# Patient Record
Sex: Male | Born: 1955 | Race: Black or African American | Hispanic: No | Marital: Married | Smoking: Never smoker
Health system: Southern US, Community
[De-identification: ages and names within clinical notes are randomized; demographics above are authoritative.]

## PROBLEM LIST (undated history)

## (undated) DIAGNOSIS — K219 Gastro-esophageal reflux disease without esophagitis: Secondary | ICD-10-CM

## (undated) DIAGNOSIS — E119 Type 2 diabetes mellitus without complications: Secondary | ICD-10-CM

## (undated) HISTORY — PX: CHOLECYSTECTOMY: SHX55

## (undated) HISTORY — PX: GASTRIC BYPASS: SHX52

---

## 2007-10-06 ENCOUNTER — Emergency Department: Payer: Self-pay | Admitting: Emergency Medicine

## 2012-12-01 ENCOUNTER — Other Ambulatory Visit (HOSPITAL_BASED_OUTPATIENT_CLINIC_OR_DEPARTMENT_OTHER): Payer: Self-pay | Admitting: Family Medicine

## 2012-12-01 DIAGNOSIS — M25561 Pain in right knee: Secondary | ICD-10-CM

## 2012-12-01 DIAGNOSIS — M25562 Pain in left knee: Secondary | ICD-10-CM

## 2012-12-05 ENCOUNTER — Ambulatory Visit (HOSPITAL_BASED_OUTPATIENT_CLINIC_OR_DEPARTMENT_OTHER)
Admission: RE | Admit: 2012-12-05 | Discharge: 2012-12-05 | Disposition: A | Discharge status: Home / Self Care | Payer: Federal, State, Local not specified - PPO | Source: Ambulatory Visit | Attending: Family Medicine | Admitting: Family Medicine

## 2012-12-05 DIAGNOSIS — M1710 Unilateral primary osteoarthritis, unspecified knee: Secondary | ICD-10-CM | POA: Insufficient documentation

## 2012-12-05 DIAGNOSIS — M25562 Pain in left knee: Secondary | ICD-10-CM

## 2012-12-05 DIAGNOSIS — M7120 Synovial cyst of popliteal space [Baker], unspecified knee: Secondary | ICD-10-CM | POA: Insufficient documentation

## 2012-12-05 DIAGNOSIS — M25561 Pain in right knee: Secondary | ICD-10-CM

## 2012-12-05 DIAGNOSIS — IMO0002 Reserved for concepts with insufficient information to code with codable children: Secondary | ICD-10-CM | POA: Insufficient documentation

## 2012-12-05 DIAGNOSIS — M25569 Pain in unspecified knee: Secondary | ICD-10-CM | POA: Insufficient documentation

## 2012-12-05 DIAGNOSIS — M23305 Other meniscus derangements, unspecified medial meniscus, unspecified knee: Secondary | ICD-10-CM | POA: Insufficient documentation

## 2013-02-21 ENCOUNTER — Observation Stay (HOSPITAL_BASED_OUTPATIENT_CLINIC_OR_DEPARTMENT_OTHER)
Admission: EM | Admit: 2013-02-21 | Discharge: 2013-02-23 | Disposition: A | Discharge status: Home / Self Care | Payer: Federal, State, Local not specified - PPO | Attending: Internal Medicine | Admitting: Internal Medicine

## 2013-02-21 ENCOUNTER — Encounter (HOSPITAL_BASED_OUTPATIENT_CLINIC_OR_DEPARTMENT_OTHER): Payer: Self-pay | Admitting: *Deleted

## 2013-02-21 ENCOUNTER — Emergency Department (HOSPITAL_BASED_OUTPATIENT_CLINIC_OR_DEPARTMENT_OTHER): Payer: Federal, State, Local not specified - PPO

## 2013-02-21 DIAGNOSIS — E119 Type 2 diabetes mellitus without complications: Secondary | ICD-10-CM | POA: Diagnosis present

## 2013-02-21 DIAGNOSIS — R0902 Hypoxemia: Secondary | ICD-10-CM

## 2013-02-21 DIAGNOSIS — R001 Bradycardia, unspecified: Secondary | ICD-10-CM

## 2013-02-21 DIAGNOSIS — R079 Chest pain, unspecified: Principal | ICD-10-CM | POA: Diagnosis present

## 2013-02-21 DIAGNOSIS — Z6841 Body Mass Index (BMI) 40.0 and over, adult: Secondary | ICD-10-CM | POA: Insufficient documentation

## 2013-02-21 DIAGNOSIS — Z9884 Bariatric surgery status: Secondary | ICD-10-CM | POA: Insufficient documentation

## 2013-02-21 DIAGNOSIS — R5381 Other malaise: Secondary | ICD-10-CM

## 2013-02-21 HISTORY — DX: Morbid (severe) obesity due to excess calories: E66.01

## 2013-02-21 HISTORY — DX: Gastro-esophageal reflux disease without esophagitis: K21.9

## 2013-02-21 HISTORY — DX: Type 2 diabetes mellitus without complications: E11.9

## 2013-02-21 LAB — COMPREHENSIVE METABOLIC PANEL
Albumin: 3.7 g/dL (ref 3.5–5.2)
Alkaline Phosphatase: 71 U/L (ref 39–117)
BUN: 9 mg/dL (ref 6–23)
Calcium: 9.4 mg/dL (ref 8.4–10.5)
GFR calc Af Amer: >90 mL/min (ref >90)
Potassium: 4 mEq/L (ref 3.5–5.1)
Sodium: 141 mEq/L (ref 135–145)
Total Protein: 7.3 g/dL (ref 6.0–8.3)

## 2013-02-21 LAB — TROPONIN I
Troponin I: <0.3 ng/mL (ref <0.30)
Troponin I: <0.3 ng/mL (ref <0.30)

## 2013-02-21 LAB — GLUCOSE, CAPILLARY: Glucose-Capillary: 97 mg/dL (ref 70–99)

## 2013-02-21 LAB — CBC WITH DIFFERENTIAL/PLATELET
Basophils Relative: 0 % (ref 0–1)
Eosinophils Absolute: 0.1 10*3/uL (ref 0.0–0.7)
Eosinophils Relative: 2 % (ref 0–5)
MCH: 27.4 pg (ref 26.0–34.0)
MCHC: 35.3 g/dL (ref 30.0–36.0)
MCV: 77.8 fL — ABNORMAL LOW (ref 78.0–100.0)
Neutrophils Relative %: 64 % (ref 43–77)
Platelets: 231 10*3/uL (ref 150–400)

## 2013-02-21 LAB — PRO B NATRIURETIC PEPTIDE: Pro B Natriuretic peptide (BNP): 81.7 pg/mL (ref 0–125)

## 2013-02-21 MED ORDER — ALUM & MAG HYDROXIDE-SIMETH 200-200-20 MG/5ML PO SUSP: 30.0000 mL | Freq: Four times a day (QID) | ORAL | Status: DC | PRN

## 2013-02-21 MED ORDER — OXYCODONE HCL 5 MG PO TABS: 5.0000 mg | ORAL_TABLET | ORAL | Status: DC | PRN

## 2013-02-21 MED ORDER — ASPIRIN EC 325 MG PO TBEC
325.0000 mg | DELAYED_RELEASE_TABLET | Freq: Every day | ORAL | Status: DC
  Administered 2013-02-22 – 2013-02-23 (×2): 325 mg via ORAL
  Filled 2013-02-21 (×3): qty 1

## 2013-02-21 MED ORDER — ACETAMINOPHEN 650 MG RE SUPP: 650.0000 mg | Freq: Four times a day (QID) | RECTAL | Status: DC | PRN

## 2013-02-21 MED ORDER — SODIUM CHLORIDE 0.9 % IJ SOLN
3.0000 mL | Freq: Two times a day (BID) | INTRAMUSCULAR | Status: DC
  Administered 2013-02-22: 3 mL via INTRAVENOUS

## 2013-02-21 MED ORDER — ACETAMINOPHEN 325 MG PO TABS: 650.0000 mg | ORAL_TABLET | Freq: Four times a day (QID) | ORAL | Status: DC | PRN

## 2013-02-21 MED ORDER — ALBUTEROL SULFATE (5 MG/ML) 0.5% IN NEBU: 2.5000 mg | INHALATION_SOLUTION | RESPIRATORY_TRACT | Status: DC | PRN

## 2013-02-21 MED ORDER — SODIUM CHLORIDE 0.9 % IJ SOLN: 3.0000 mL | Freq: Two times a day (BID) | INTRAMUSCULAR | Status: DC

## 2013-02-21 MED ORDER — ONDANSETRON HCL 4 MG/2ML IJ SOLN: 4.0000 mg | Freq: Four times a day (QID) | INTRAMUSCULAR | Status: DC | PRN

## 2013-02-21 MED ORDER — ONDANSETRON HCL 4 MG PO TABS: 4.0000 mg | ORAL_TABLET | Freq: Four times a day (QID) | ORAL | Status: DC | PRN

## 2013-02-21 MED ORDER — GUAIFENESIN-DM 100-10 MG/5ML PO SYRP: 5.0000 mL | ORAL_SOLUTION | ORAL | Status: DC | PRN

## 2013-02-21 MED ORDER — SODIUM CHLORIDE 0.9 % IV BOLUS (SEPSIS)
500.0000 mL | Freq: Once | INTRAVENOUS | Status: AC
  Administered 2013-02-21: 500 mL via INTRAVENOUS

## 2013-02-21 MED ORDER — PANTOPRAZOLE SODIUM 40 MG PO TBEC
40.0000 mg | DELAYED_RELEASE_TABLET | Freq: Every day | ORAL | Status: DC
  Administered 2013-02-22 – 2013-02-23 (×2): 40 mg via ORAL
  Filled 2013-02-21 (×2): qty 1

## 2013-02-21 MED ORDER — SODIUM CHLORIDE 0.9 % IJ SOLN: 3.0000 mL | INTRAMUSCULAR | Status: DC | PRN

## 2013-02-21 MED ORDER — ASPIRIN 81 MG PO CHEW
324.0000 mg | CHEWABLE_TABLET | Freq: Once | ORAL | Status: AC
  Administered 2013-02-21: 324 mg via ORAL
  Filled 2013-02-21: qty 4

## 2013-02-21 MED ORDER — ENOXAPARIN SODIUM 40 MG/0.4ML ~~LOC~~ SOLN
40.0000 mg | SUBCUTANEOUS | Status: DC
  Administered 2013-02-21 – 2013-02-22 (×2): 40 mg via SUBCUTANEOUS
  Filled 2013-02-21 (×3): qty 0.4

## 2013-02-21 MED ORDER — SODIUM CHLORIDE 0.9 % IV SOLN: 250.0000 mL | INTRAVENOUS | Status: DC | PRN

## 2013-02-21 MED ORDER — NITROGLYCERIN 0.4 MG SL SUBL: 0.4000 mg | SUBLINGUAL_TABLET | SUBLINGUAL | Status: DC | PRN

## 2013-02-21 NOTE — ED Notes (Signed)
Patient in Xray

## 2013-02-21 NOTE — ED Notes (Signed)
States that he "just hasnt felt good" since Friday. He can't explain what hes feeling. Says that he just feels tired and has a slight headache. He says that every once in awhile he thinks he feels a little chest tightness. Patient very vague in description.

## 2013-02-21 NOTE — ED Provider Notes (Signed)
History     CSN: [id number]  Arrival date & time 02/21/13  1224   First MD Initiated Contact with Patient 02/21/13 1254      Chief Complaint  Patient presents with  . Fatigue    (Consider location/radiation/quality/duration/timing/severity/associated sxs/prior treatment) Patient is a 57 y.o. male presenting with chest pain. The history is provided by the patient.  Chest Pain Pain location:  Substernal area Pain quality: pressure   Pain radiates to:  Does not radiate Pain radiates to the back: no   Pain severity:  Mild Onset quality:  Unable to specify Timing:  Intermittent Progression:  Waxing and waning Chronicity:  Recurrent Context: not breathing, not eating, no intercourse, not lifting and not raising an arm   Relieved by:  Nothing Worsened by:  Nothing tried Ineffective treatments:  None tried Associated symptoms: near-syncope and weakness   Associated symptoms: no cough and no diaphoresis     Past Medical History  Diagnosis Date  . Diabetes mellitus without complication     Past Surgical History  Procedure Laterality Date  . Gastric bypass    . Cholecystectomy      No family history on file.  History  Substance Use Topics  . Smoking status: Never Smoker   . Smokeless tobacco: Not on file  . Alcohol Use: No      Review of Systems  Constitutional: Negative for diaphoresis.  Respiratory: Negative for cough.   Cardiovascular: Positive for chest pain and near-syncope.  Neurological: Positive for weakness.  All other systems reviewed and are negative.    Allergies  Review of patient's allergies indicates no known allergies.  Home Medications  No current outpatient prescriptions on file.  BP 91/76  Pulse 52  Temp(Src) 98.8 F (37.1 C) (Oral)  Resp 18  SpO2 98%  Physical Exam  Nursing note and vitals reviewed. Constitutional: He is oriented to person, place, and time. He appears well-developed and well-nourished.  HENT:  Head:  Normocephalic and atraumatic.  Eyes: Conjunctivae and EOM are normal. Pupils are equal, round, and reactive to light.  Neck: Normal range of motion. Neck supple.  Cardiovascular: Normal rate, regular rhythm, normal heart sounds and intact distal pulses.   Pulmonary/Chest: Effort normal and breath sounds normal.  Abdominal: Soft. Bowel sounds are normal.  Musculoskeletal: Normal range of motion. He exhibits no edema and no tenderness.  Neurological: He is alert and oriented to person, place, and time. He has normal reflexes.  Skin: Skin is warm and dry.  Psychiatric: He has a normal mood and affect. His behavior is normal. Judgment and thought content normal.    ED Course  Procedures (including critical care time)  Labs Reviewed  CBC WITH DIFFERENTIAL - Abnormal; Notable for the following:    MCV 77.8 (*)    RDW 16.2 (*)    All other components within normal limits  COMPREHENSIVE METABOLIC PANEL - Abnormal; Notable for the following:    Glucose, Bld 103 (*)    All other components within normal limits  TROPONIN I   Dg Chest 2 View  02/21/2013   *RADIOLOGY REPORT*  Clinical Data: Chest pain  CHEST - 2 VIEW  Comparison: None.  Findings: Negative for pulmonary edema, focal airspace consolidation, pleural effusion or pneumothorax.  The cardiopericardial silhouette is at the upper limits of normal for size.  The lungs are mildly hyperexpanded there is mild central airway thickening/peribronchial cuffing.  IMPRESSION:  1.  No acute cardiopulmonary disease 2.  Top normal heart size 3.  Mild pulmonary hyperexpansion and central bronchitic changes are nonspecific and can be seen with chronic obstruction both from the COPD and asthma.   Original Report Authenticated By: Malachy Moan, M.D.     No diagnosis found.   Date: 02/21/2013  Rate: 50  Rhythm: normal sinus rhythm  QRS Axis: normal  Intervals: normal  ST/T Wave abnormalities: nonspecific ST changes  Conduction Disutrbances:none   Narrative Interpretation:   Old EKG Reviewed: none available    MDM  Patient with new onset chest pain for two days.  Patient with abnormal ekg, but normal vital signs and lab w/u so far.       Discussed with Dr. Arthor Captain- plan observation for chest pain.   Hilario Quarry, MD 02/21/13 1538

## 2013-02-21 NOTE — H&P (Addendum)
PATIENT DETAILS Name: Mitchell Hampton Age: 57 y.o. Sex: male Date of Birth: [birth year] Admit Date: 02/21/2013 ZOX:[id number],WUJW, PA-C   CHIEF COMPLAINT:  Generalized weakness Chest pain  HPI: Mitchell Hampton is a 57 y.o. male with a Past Medical History of morbid obesity status post gastric bypass surgery in 2005, history of diabetes currently on diet and exercise control, who presents today with the above noted complaint. Patient claims that since Friday he has been having generalized fatigue, he claims that his does not feel his usual self. He denies any nausea, vomiting or diarrhea. He denies any abdominal pain. His complains are vague and he has difficulty elaborating. He claims that at work on Friday he did not "feel right", as a result he went home early. He claims that he gets this generalized fatigue spells that comes and goes. While at his mother's house today, he got another spell and as a result he presented to med center [facility] for further evaluation. He also claimed that he has been having intermittent mild chest discomfort for the past few days as well. He was then referred to the hospitalist service for further evaluation and treatment. He denies any active chest pain during my interview. He denies any shortness of breath either exertional or at rest. He denies any fever, nausea, vomiting, dysuria, hematuria or diarrhea. He does claim to have intermittent headaches that are currently not present.  ALLERGIES:  No Known Allergies  PAST MEDICAL HISTORY: Past Medical History  Diagnosis Date  . Diabetes mellitus without complication     PAST SURGICAL HISTORY: Past Surgical History  Procedure Laterality Date  . Gastric bypass    . Cholecystectomy      MEDICATIONS AT HOME: Prior to Admission medications   Not on File    FAMILY HISTORY: No family history of coronary artery disease of premature death.  SOCIAL HISTORY:  reports that he has never smoked. He does not have any  smokeless tobacco history on file. He reports that he does not drink alcohol or use illicit drugs.  REVIEW OF SYSTEMS:  Constitutional:   No  weight loss, night sweats,  Fevers, chills.  HEENT:    No headaches, Difficulty swallowing,Tooth/dental problems,Sore throat,  No sneezing, itching, ear ache, nasal congestion, post nasal drip,   Cardio-vascular: No chest pain,  Orthopnea, PND, swelling in lower extremities, anasarca,  dizziness, palpitations  GI:  No heartburn, indigestion, abdominal pain, nausea, vomiting, diarrhea, change in bowel habits, loss of appetite  Resp: No shortness of breath with exertion or at rest.  No excess mucus, no productive cough, No non-productive cough,  No coughing up of blood.No change in color of mucus.No wheezing.No chest wall deformity  Skin:  no rash or lesions.  GU:  no dysuria, change in color of urine, no urgency or frequency.  No flank pain.  Musculoskeletal: No joint pain or swelling.  No decreased range of motion.  No back pain.  Psych: No change in mood or affect. No depression or anxiety.  No memory loss.   PHYSICAL EXAM: Blood pressure 144/62, pulse 61, temperature 98.3 F (36.8 C), temperature source Oral, resp. rate 18, weight 144.697 kg (319 lb), SpO2 100.00%.  General appearance :Awake, alert, not in any distress. Speech Clear. Not toxic Looking HEENT: Atraumatic and Normocephalic, pupils equally reactive to light and accomodation Neck: supple, no JVD. No cervical lymphadenopathy.  Chest:Good air entry bilaterally, no added sounds  CVS: S1 S2 regular, no murmurs.  Abdomen: Bowel sounds present, Non tender  and not distended with no gaurding, rigidity or rebound. Extremities: B/L Lower Ext shows no edema, both legs are warm to touch Neurology: Awake alert, and oriented X 3, CN II-XII intact, Non focal Skin:No Rash Wounds:N/A  LABS ON ADMISSION:   Recent Labs  02/21/13 1327  NA 141  K 4.0  CL 105  CO2 28  GLUCOSE  103*  BUN 9  CREATININE 0.90  CALCIUM 9.4    Recent Labs  02/21/13 1327  AST 19  ALT 19  ALKPHOS 71  BILITOT 0.4  PROT 7.3  ALBUMIN 3.7   No results found for this basename: LIPASE, AMYLASE,  in the last 72 hours  Recent Labs  02/21/13 1327  WBC 8.3  NEUTROABS 5.3  HGB 14.1  HCT 40.0  MCV 77.8*  PLT 231    Recent Labs  02/21/13 1327  TROPONINI <0.30   No results found for this basename: DDIMER,  in the last 72 hours No components found with this basename: POCBNP,    RADIOLOGIC STUDIES ON ADMISSION: Dg Chest 2 View  02/21/2013   *RADIOLOGY REPORT*  Clinical Data: Chest pain  CHEST - 2 VIEW  Comparison: None.  Findings: Negative for pulmonary edema, focal airspace consolidation, pleural effusion or pneumothorax.  The cardiopericardial silhouette is at the upper limits of normal for size.  The lungs are mildly hyperexpanded there is mild central airway thickening/peribronchial cuffing.  IMPRESSION:  1.  No acute cardiopulmonary disease 2.  Top normal heart size 3.  Mild pulmonary hyperexpansion and central bronchitic changes are nonspecific and can be seen with chronic obstruction both from the COPD and asthma.   Original Report Authenticated By: Malachy Moan, M.D.     EKG: Independently reviewed. Mild sinus bradycardia  ASSESSMENT AND PLAN: Present on Admission:  . Chest pain - Very atypical, we'll cycle cardiac enzymes and get a 2-D echocardiogram  - Place on aspirin and PPI  - Monitor in telemetry and follow clinical course.   . Fatigue  -? Etiology  - No fever, no leukocytosis or any other symptoms that suggest infection at this point. Electrolytes are within normal limits as well. - We'll check a UA, TSH, A1c check a lipid panel in a.m. - Monitor overnight to see if something becomes more evident in the morning, suspect he may just need reassurance an outpatient followup with his primary care practitioner  . Morbid obesity - Counseled regarding  importance of weight loss. Unfortunately he is reading a significant amount of his weight loss gastric bypass surgery   . Diabetes - Apparently prior to his gastric bypass surgery in 2005, he was on oral hypoglycemic agents. This was discontinued post gastric bypass surgery and has been maintained on diet and exercise control. Will check the A1c.  Further plan will depend as patient's clinical course evolves and further radiologic and laboratory data become available. Patient will be monitored closely.   DVT Prophylaxis: Prophylactic Lovenox  Code Status: Full Code  Total time spent for admission equals 45 minutes.  [facility] Pager [phone]  If 7PM-7AM, please contact night-coverage [email or web address] Password [facility] 02/21/2013, 6:40 PM

## 2013-02-21 NOTE — Progress Notes (Signed)
PENDING ACCEPTANCE TRANFER NOTE:  Call received from:    D. Ray @ [facility]  REASON FOR REQUESTING TRANSFER:    Chest pressure  HPI:   H/o morbid obesity, came in complaining about fatigue and vague complaints of "just not feeling well", upon further questioning patient said he has mild chest pressure, substernal, which does not radiate, comes and goes. Has no H/O smoking, DM, HTN, PVD or CKD. His EKG showed bradycardia and Trop is negative. Dr. Rosalia Hammers felt he showed be observed to r/o ACS. I think he is low risk if his enzymes are negative can be D/C to follow up as OP for further eval   PLAN:  According to telephone report, this patient was accepted for transfer to [facility],   Under [facility] team:  10,  I have requested an order be written to call Flow Manager at [phone] upon patient arrival to the floor for final physician assignment who will do the admission and give admitting orders.  SIGNED: Clint Lipps, MD [facility]  02/21/2013, 3:38 PM

## 2013-02-22 ENCOUNTER — Encounter (HOSPITAL_COMMUNITY): Payer: Self-pay | Admitting: Nurse Practitioner

## 2013-02-22 ENCOUNTER — Observation Stay (HOSPITAL_COMMUNITY): Payer: Federal, State, Local not specified - PPO

## 2013-02-22 DIAGNOSIS — R0902 Hypoxemia: Secondary | ICD-10-CM

## 2013-02-22 DIAGNOSIS — I498 Other specified cardiac arrhythmias: Secondary | ICD-10-CM

## 2013-02-22 DIAGNOSIS — I517 Cardiomegaly: Secondary | ICD-10-CM

## 2013-02-22 DIAGNOSIS — R5381 Other malaise: Secondary | ICD-10-CM

## 2013-02-22 DIAGNOSIS — R5383 Other fatigue: Secondary | ICD-10-CM

## 2013-02-22 LAB — TROPONIN I
Troponin I: <0.3 ng/mL (ref <0.30)
Troponin I: <0.3 ng/mL (ref <0.30)

## 2013-02-22 LAB — GLUCOSE, CAPILLARY: Glucose-Capillary: 95 mg/dL (ref 70–99)

## 2013-02-22 LAB — D-DIMER, QUANTITATIVE: D-Dimer, Quant: 0.61 ug/mL-FEU — ABNORMAL HIGH (ref 0.00–0.48)

## 2013-02-22 LAB — LIPID PANEL
Cholesterol: 135 mg/dL (ref 0–200)
Total CHOL/HDL Ratio: 3.6 RATIO
Triglycerides: 139 mg/dL (ref <150)

## 2013-02-22 LAB — COMPREHENSIVE METABOLIC PANEL
Alkaline Phosphatase: 61 U/L (ref 39–117)
BUN: 9 mg/dL (ref 6–23)
Calcium: 8.8 mg/dL (ref 8.4–10.5)
Creatinine, Ser: 0.78 mg/dL (ref 0.50–1.35)
GFR calc Af Amer: >90 mL/min (ref >90)
Glucose, Bld: 91 mg/dL (ref 70–99)
Potassium: 4.1 mEq/L (ref 3.5–5.1)
Total Protein: 6.5 g/dL (ref 6.0–8.3)

## 2013-02-22 LAB — CBC
HCT: 36.4 % — ABNORMAL LOW (ref 39.0–52.0)
RBC: 4.7 MIL/uL (ref 4.22–5.81)
RDW: 15.6 % — ABNORMAL HIGH (ref 11.5–15.5)
WBC: 7.5 10*3/uL (ref 4.0–10.5)

## 2013-02-22 LAB — HEMOGLOBIN A1C
Hgb A1c MFr Bld: 6.3 % — ABNORMAL HIGH (ref <5.7)
Mean Plasma Glucose: 134 mg/dL — ABNORMAL HIGH (ref <117)

## 2013-02-22 LAB — TSH
TSH: 2.445 u[IU]/mL (ref 0.350–4.500)
TSH: 2.283 u[IU]/mL (ref 0.350–4.500)

## 2013-02-22 LAB — PROTIME-INR: INR: 0.99 (ref 0.00–1.49)

## 2013-02-22 MED ORDER — IOHEXOL 350 MG/ML SOLN
100.0000 mL | Freq: Once | INTRAVENOUS | Status: AC | PRN
  Administered 2013-02-22: 100 mL via INTRAVENOUS

## 2013-02-22 NOTE — Progress Notes (Signed)
Ambulated patient per order. Pt's resting HR upper 40's. HR increased as high as 92 with O2 sats decreasing to 89% on RA. Pt currently resting comfortably in bed with HR back in low 50's with sats at 99% RA.

## 2013-02-22 NOTE — Progress Notes (Signed)
Utilization review completed.  

## 2013-02-22 NOTE — Progress Notes (Signed)
[facility] PROGRESS NOTE  Mitchell Hampton ZOX:[id number] DOB: 02-16-1956 DOA: 02/21/2013 PCP: Mitchell Hampton  Assessment/Plan: Chest Pain/Fatigue/Hypoxemia/Hypoglycemia -He has now ruled out for ACS. -Cards consult requested to see if further cardiac workup needed as an inpatient. -Patient was noted to desat to 89% with ambulation. -DDimer returned elevated at 0.61. Will order CT Angio Chest to r/o PE. -Was also noted to be hypoglycemic: has a h/o DM, but is currently diet controlled. -Hypoglycemia could be contributing to fatigue. Will check CBGs q 4 hours. -Appreciate cardiology recs. -If CTA negative, further workup for his fatigue can be performed as an outpatient. -B12 ok at >400.  Code Status: Full code Family Communication: Wife Mitchell Hampton at bedside  Disposition Plan: Home when ready, anticipate 24-48 hours.   Consultants:  Cardiology, Dr. Tenny Hampton   Antibiotics:  None   Subjective: No complaints. Wants to go home.  Objective: Filed Vitals:   02/21/13 1650 02/21/13 1811 02/21/13 2113 02/22/13 0700  BP: 133/81 144/62 111/60 106/63  Pulse: 55 61 60 51  Temp:  98.3 F (36.8 C) 98.7 F (37.1 C) 98.3 F (36.8 C)  TempSrc:  Oral Oral   Resp: 16 18 18 18   Height:      Weight:      SpO2: 100% 100% 96% 94%   No intake or output data in the 24 hours ending 02/22/13 1414 Filed Weights   02/21/13 1509  Weight: 144.697 kg (319 lb)    Exam:   General:  AA Ox3, NAD  Cardiovascular: RRR, no M/R/G  Respiratory: CTA B  Abdomen: Obse/S/NT/ND/+BS/no masses or organomegaly noted  Extremities: no C/C/E/+pedal pulses   Neurologic:  Grossly intact and non-focal.  Data Reviewed: Basic Metabolic Panel:  Recent Labs Lab 02/21/13 1327 02/22/13 0415  NA 141 142  K 4.0 4.1  CL 105 109  CO2 28 27  GLUCOSE 103* 91  BUN 9 9  CREATININE 0.90 0.78  CALCIUM 9.4 8.8   Liver Function Tests:  Recent Labs Lab 02/21/13 1327 02/22/13 0415  AST 19 18  ALT 19 15   ALKPHOS 71 61  BILITOT 0.4 0.4  PROT 7.3 6.5  ALBUMIN 3.7 3.2*   No results found for this basename: LIPASE, AMYLASE,  in the last 168 hours No results found for this basename: AMMONIA,  in the last 168 hours CBC:  Recent Labs Lab 02/21/13 1327 02/22/13 0415  WBC 8.3 7.5  NEUTROABS 5.3  --   HGB 14.1 12.7*  HCT 40.0 36.4*  MCV 77.8* 77.4*  PLT 231 221   Cardiac Enzymes:  Recent Labs Lab 02/21/13 1327 02/21/13 2019 02/22/13 0139 02/22/13 0740  TROPONINI <0.30 <0.30 <0.30 <0.30   BNP (last 3 results)  Recent Labs  02/21/13 2019  PROBNP 81.7   CBG:  Recent Labs Lab 02/21/13 2201 02/22/13 0731 02/22/13 1150  GLUCAP 97 95 60*    No results found for this or any previous visit (from the past 240 hour(s)).   Studies: Dg Chest 2 View  02/21/2013   *RADIOLOGY REPORT*  Clinical Data: Chest pain  CHEST - 2 VIEW  Comparison: None.  Findings: Negative for pulmonary edema, focal airspace consolidation, pleural effusion or pneumothorax.  The cardiopericardial silhouette is at the upper limits of normal for size.  The lungs are mildly hyperexpanded there is mild central airway thickening/peribronchial cuffing.  IMPRESSION:  1.  No acute cardiopulmonary disease 2.  Top normal heart size 3.  Mild pulmonary hyperexpansion and central bronchitic changes are nonspecific and can  be seen with chronic obstruction both from the COPD and asthma.   Original Report Authenticated By: Mitchell Hampton, M.D.    Scheduled Meds: . aspirin EC  325 mg Oral Daily  . enoxaparin (LOVENOX) injection  40 mg Subcutaneous Q24H  . pantoprazole  40 mg Oral Q1200  . sodium chloride  3 mL Intravenous Q12H  . sodium chloride  3 mL Intravenous Q12H   Continuous Infusions:   Principal Problem:   Chest pain Active Problems:   Morbid obesity   Diabetes   Hypoxemia   Other malaise and fatigue    Time spent: 35 minutes    Mitchell Hampton  [facility] Pager [phone]  If  7PM-7AM, please contact night-coverage at [email or web address], password [facility] 02/22/2013, 2:14 PM  LOS: 1 day

## 2013-02-22 NOTE — H&P (Signed)
CARDIOLOGY CONSULT NOTE  Patient ID: Mitchell Hampton MRN: [id number], DOB/AGE: [birth year]   Admit date: 02/21/2013 Date of Consult: 02/22/2013  Primary Physician: Ethel Rana Primary Cardiologist: new - seen by P. Tenny Craw, MD   Pt. Profile  57 y/o male w/o prior cardiac hx who was admitted yesterday with fatigue/malaise and also reported intermittent indigestion-like chest pain.  Problem List  Past Medical History  Diagnosis Date  . Diabetes mellitus without complication     a. diet controlled (01/2013)  . Morbid obesity     a. s/p gastric bypass  . GERD (gastroesophageal reflux disease)     Past Surgical History  Procedure Laterality Date  . Gastric bypass      a. 2005  . Cholecystectomy      a. 2004     Allergies  No Known Allergies  HPI   57 y/o male without prior cardiac history.  He does have a h/o diabetes, which is currently diet-controlled.  He is also s/p gastric bypass in 2005.  Though he initially lost weight, he has gained much of it back and is currently 319 lbs.  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  He was in his USOH until 6/6, when while @ work he noted "not feeling like [him]self."  He describes this further by saying that he felt somewhat fatigued.  He was not experiencing chest pain, sob, or palpitations.  He left work and went to Urgent Care where labs were evaluated and were apparently unrevealing.  He was sent home w/o further treatment.  He continued to feel malaise throughout the evening on Friday but later slept well and felt pretty normal throughout the early afternoon hours of Saturday.  He did note recurrent fatigue and malaise however late Saturday afternoon, again without associated Ss.  When he laid down for bed on Saturday night, he felt mildly dyspneic.  This passed with sitting up for a few mins and then he went on to sleep normally w/o recurrent orthopnea.  On Sunday morning he felt well again but then after watering his lawn,  again noted malaise w/o associated Ss.  He and his wife decided to come into the ED for eval.  There, ECG was normal, as were troponins and other blood work.  He reported that he sometimes experiences midsternal/epigastric pain that has been successfully treated over the years with prn pepcid.  He has r/o for MI.  He has had no further chest pain or malaise/fatigue.  We've been asked to eval.  Inpatient Medications  . aspirin EC  325 mg Oral Daily  . enoxaparin (LOVENOX) injection  40 mg Subcutaneous Q24H  . pantoprazole  40 mg Oral Q1200  . sodium chloride  3 mL Intravenous Q12H  . sodium chloride  3 mL Intravenous Q12H   Family History Family History  Problem Relation Age of Onset  . Stroke Mother     alive in her late 34's, cva in her 34's.  . Other Father     unaware of his medical hx.  . Other Other     1/2 brother with h/o enlarged heart and sudden death.    Social History History   Social History  . Marital Status: Married    Spouse Name: N/A    Number of Children: N/A  . Years of Education: N/A   Occupational History  . Not on file.   Social History Main Topics  . Smoking status: Never Smoker   . Smokeless  tobacco: Not on file  . Alcohol Use: No  . Drug Use: No  . Sexually Active: Not on file   Other Topics Concern  . Not on file   Social History Narrative   Lives in [place] with wife.  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  He does not routinely exercise.  He has gained back much of his pre-gastric bypass surgery weight.    Review of Systems  General:  Generalized malaise/fatigue.  No chills, fever, night sweats or weight changes.  Cardiovascular:  Occasional epigastric and midsternal chest pain since his gastric bypass in 2005.  No recent change in frequency.  No dyspnea on exertion though he has noted some orthopnea over the past 2-3 days.  No edema, palpitations, paroxysmal nocturnal dyspnea. Dermatological: No rash,  lesions/masses Respiratory: No cough, dyspnea Urologic: No hematuria, dysuria Abdominal:   No nausea, vomiting, diarrhea, bright red blood per rectum, melena, or hematemesis Neurologic:  No visual changes, wkns, changes in mental status. All other systems reviewed and are otherwise negative except as noted above.  Physical Exam  Blood pressure 106/63, pulse 51, temperature 98.3 F (36.8 C), temperature source Oral, resp. rate 18, height 6' (1.829 m), weight 319 lb (144.697 kg), SpO2 94.00%.  General: Pleasant, NAD Psych: Normal affect. Neuro: Alert and oriented X 3. Moves all extremities spontaneously. HEENT: Normal  Neck: Supple without bruits or JVD. Lungs:  Resp regular and unlabored, CTA. Heart: RRR no s3, s4, or murmurs. Abdomen: Soft, non-tender, non-distended, BS + x 4.  Extremities: No clubbing, cyanosis or edema. DP/PT/Radials 2+ and equal bilaterally.  Labs   Recent Labs  02/21/13 1327 02/21/13 2019 02/22/13 0139 02/22/13 0740  TROPONINI <0.30 <0.30 <0.30 <0.30   Lab Results  Component Value Date   WBC 7.5 02/22/2013   HGB 12.7* 02/22/2013   HCT 36.4* 02/22/2013   MCV 77.4* 02/22/2013   PLT 221 02/22/2013     Recent Labs Lab 02/22/13 0415  NA 142  K 4.1  CL 109  CO2 27  BUN 9  CREATININE 0.78  CALCIUM 8.8  PROT 6.5  BILITOT 0.4  ALKPHOS 61  ALT 15  AST 18  GLUCOSE 91   Lab Results  Component Value Date   CHOL 135 02/22/2013   HDL 37* 02/22/2013   LDLCALC 70 02/22/2013   TRIG 139 02/22/2013   Radiology/Studies  Dg Chest 2 View  02/21/2013   *RADIOLOGY REPORT*  Clinical Data: Chest pain  CHEST - 2 VIEW  Comparison: None.  Findings: Negative for pulmonary edema, focal airspace consolidation, pleural effusion or pneumothorax.  The cardiopericardial silhouette is at the upper limits of normal for size.  The lungs are mildly hyperexpanded there is mild central airway thickening/peribronchial cuffing.  IMPRESSION:  1.  No acute cardiopulmonary disease 2.  Top  normal heart size 3.  Mild pulmonary hyperexpansion and central bronchitic changes are nonspecific and can be seen with chronic obstruction both from the COPD and asthma.   Original Report Authenticated By: Malachy Moan, M.D.   ECG  Sb, 50, no acute st/t changes.  ASSESSMENT AND PLAN  1.  Chest Pain:  Atypical for angina.  Pt reports hx of this pain dating back to gastric bypass in 2005 and believes it to be indigestion.  More likely to occur if he lies down too soon after a meal and always resolves with prn pepcid.  CE negative and ECG w/o any acute findings.  In absence of objective  evidence of ischemia with atypical Ss, would not rec further inpatient ischemic eval.  Cont PPI.  Can consider outpt ETT to r/o ischemia and assess chronotropic response.  2.  GERD:  PPI as above.  3.  Fatigue/malaise:  ? Etiology.  No evidence of ischemia.  His resting HR is slow - 40's to 50's in the absence of AVN blocking agents.  Ambulate here to assess for chronotropic response and consider outpt ETT as above.  Check TSH if not already ordered.  4.  Anemia:  CBC nl on admission.  H/H drop likely 2/2 blood draws.  No evidence of bleeding.  5.  DM:  Diet controlled.  Glucose 91 this AM.   Signed, Nicolasa Ducking, NP 02/22/2013, 10:18 AM  Patient seen and examined.  Agree with findings as noted above.  Patient with weak spells  Of note he was weak a little earlier today and glucose was 61 at the time.  Question if this is related to spells With walking his HR did increase some  He has had no signif pauses.  ALso of note his O2 sat on RA after walking went to 89%  On exam, patient is in NAD  Neck  JVP normal  Lungs:  CTA  Moving air.  Cardiac RRR.  No S3  No Murmurs  Ext No edema. I am not convinced weak spells related to cardiac problems  He does have some risk factors REcomm:  Check d dimer.  Check O2 sat walking again Would follow glucose esp when having spells. We can set up for outpatient stress  myoview to r/o ischemia and evaluate HR responsiveness.

## 2013-02-22 NOTE — Progress Notes (Signed)
  Echocardiogram 2D Echocardiogram has been performed.  Pollyanna Levay FRANCES 02/22/2013, 3:33 PM

## 2013-02-23 LAB — URINALYSIS, ROUTINE W REFLEX MICROSCOPIC
Hgb urine dipstick: NEGATIVE
Nitrite: NEGATIVE
Specific Gravity, Urine: 1.022 (ref 1.005–1.030)
Urobilinogen, UA: 0.2 mg/dL (ref 0.0–1.0)

## 2013-02-23 LAB — GLUCOSE, CAPILLARY
Glucose-Capillary: 126 mg/dL — ABNORMAL HIGH (ref 70–99)
Glucose-Capillary: 93 mg/dL (ref 70–99)
Glucose-Capillary: 94 mg/dL (ref 70–99)

## 2013-02-23 MED ORDER — ASPIRIN 325 MG PO TBEC: 325.0000 mg | DELAYED_RELEASE_TABLET | Freq: Every day | ORAL | Status: AC

## 2013-02-23 NOTE — Progress Notes (Signed)
Patient ID: Mitchell Hampton, male   DOB: 21-Jun-1956, 57 y.o.   MRN: [id number]   Please refer to the full consultation done yesterday. 2-D echo is technically difficult but revealed no diagnostic abnormalities. Recommendations are in the note yesterday. Cardiology will sign off.  Jerral Bonito, MD

## 2013-02-23 NOTE — Discharge Summary (Signed)
Physician Discharge Summary  Mitchell Hampton ZOX:[id number] DOB: Mar 12, 1956 DOA: 02/21/2013  PCP: Ethel Rana  Admit date: 02/21/2013 Discharge date: 02/23/2013  Time spent: Greater than 30 minutes  Recommendations for Outpatient Follow-up:  -Advised to follow up with his PCP in 2 weeks. -Has an exercise stress test scheduled on 6/25 with LB cards.   Discharge Diagnoses:  Principal Problem:   Chest pain Active Problems:   Morbid obesity   Diabetes   Hypoxemia   Other malaise and fatigue   Discharge Condition: Stable and improved.  Filed Weights   02/21/13 1509 02/23/13 0444  Weight: 144.697 kg (319 lb) 147.192 kg (324 lb 8 oz)    History of present illness:  Patient is a 57 y.o. male with a Past Medical History of morbid obesity status post gastric bypass surgery in 2005, history of diabetes currently on diet and exercise control, who presents today with CP and fatigue. Patient claims that since Friday he has been having generalized fatigue, he claims that his does not feel his usual self. He denies any nausea, vomiting or diarrhea. He denies any abdominal pain. His complains are vague and he has difficulty elaborating. He claims that at work on Friday he did not "feel right", as a result he went home early. He claims that he gets this generalized fatigue spells that comes and goes. While at his mother's house today, he got another spell and as a result he presented to med center [facility] for further evaluation. He also claimed that he has been having intermittent mild chest discomfort for the past few days as well. He was then referred to the hospitalist service for further evaluation and treatment.  He denies any active chest pain during my interview. He denies any shortness of breath either exertional or at rest. He denies any fever, nausea, vomiting, dysuria, hematuria or diarrhea. He does claim to have intermittent headaches that are currently not present.   Hospital Course:    Chest Pain -Ruled out for ACS. -Because of his risk factors has been scheduled for OP stress test as detailed above. -CT Angio negative for PE.  Hypoxemia -Transient with ambulation. -Has been ambulated today with sats remaining above 95% on RA. -CT Angio negative for PE. -CT did show some bibasilar atx, which could have been contributing.  Chronic Fatigue -TSH/B12 WNL. -Continue OP workup for this issue.  Diet-Controlled DM -Had one episode of hypoglycemia.  Procedures:  None   Consultations:  Cardiology, Dr. Tenny Craw  Discharge Instructions  Discharge Orders   Future Appointments Provider Department Dept Phone   03/10/2013 2:30 PM Beatrice Lecher, PA-C [facility] Main Office [place]) [phone]   Joint Appt [facility] Main Office [place]) [phone]   Future Orders Complete By Expires     Diet - low sodium heart healthy  As directed     Discontinue IV  As directed     Increase activity slowly  As directed         Medication List    TAKE these medications       aspirin 325 MG EC tablet  Take 1 tablet (325 mg total) by mouth daily.     multivitamin with minerals Tabs  Take 1 tablet by mouth daily.     VITAMIN B-12 PO  Take 1 tablet by mouth daily.       No Known Allergies     Follow-up Information   Follow up with Tereso Newcomer, PA-C On 03/10/2013. (2:30 - Exercise  Treadmill Test - wear comfortable clothing and sneakers.)    Contact information:   [address] [place] [address] [phone]       Follow up with HEPLER,MARK, PA-C. Schedule an appointment as soon as possible for a visit in 2 weeks.   Contact information:   [address] [place] [address] [phone]        The results of significant diagnostics from this hospitalization (including imaging, microbiology, ancillary and laboratory) are listed below for reference.    Significant Diagnostic Studies: Dg Chest 2  View  02/21/2013   *RADIOLOGY REPORT*  Clinical Data: Chest pain  CHEST - 2 VIEW  Comparison: None.  Findings: Negative for pulmonary edema, focal airspace consolidation, pleural effusion or pneumothorax.  The cardiopericardial silhouette is at the upper limits of normal for size.  The lungs are mildly hyperexpanded there is mild central airway thickening/peribronchial cuffing.  IMPRESSION:  1.  No acute cardiopulmonary disease 2.  Top normal heart size 3.  Mild pulmonary hyperexpansion and central bronchitic changes are nonspecific and can be seen with chronic obstruction both from the COPD and asthma.   Original Report Authenticated By: Malachy Moan, M.D.   Ct Angio Chest Pe W/cm &/or Wo Cm  02/22/2013   *RADIOLOGY REPORT*  Clinical Data: Elevated D-dimer question pulmonary embolism, history diabetes  CT ANGIOGRAPHY CHEST  Technique:  Multidetector CT imaging of the chest using the standard protocol during bolus administration of intravenous contrast. Multiplanar reconstructed images including MIPs were obtained and reviewed to evaluate the vascular anatomy.  Contrast: OMNIPAQUE IOHEXOL 350 MG/ML SOLN  Comparison: None  Findings: Aorta normal caliber. Minimal atherosclerotic calcification aorta and coronary arteries. Prior gastric bypass surgery. Remaining visualized portions of upper abdomen unremarkable. No thoracic adenopathy. Scattered artifacts secondary to body habitus and respiratory motion. No definite evidence of pulmonary embolism. Dependent atelectasis in both lungs. No acute infiltrate, pleural effusion or pneumothorax. Bones unremarkable.  IMPRESSION: Dependent atelectasis in the bilateral lower lobes. No evidence of pulmonary embolism. Prior gastric bypass surgery.   Original Report Authenticated By: Ulyses Southward, M.D.    Microbiology: No results found for this or any previous visit (from the past 240 hour(s)).   Labs: Basic Metabolic Panel:  Recent Labs Lab 02/21/13 1327  02/22/13 0415  NA 141 142  K 4.0 4.1  CL 105 109  CO2 28 27  GLUCOSE 103* 91  BUN 9 9  CREATININE 0.90 0.78  CALCIUM 9.4 8.8   Liver Function Tests:  Recent Labs Lab 02/21/13 1327 02/22/13 0415  AST 19 18  ALT 19 15  ALKPHOS 71 61  BILITOT 0.4 0.4  PROT 7.3 6.5  ALBUMIN 3.7 3.2*   No results found for this basename: LIPASE, AMYLASE,  in the last 168 hours No results found for this basename: AMMONIA,  in the last 168 hours CBC:  Recent Labs Lab 02/21/13 1327 02/22/13 0415  WBC 8.3 7.5  NEUTROABS 5.3  --   HGB 14.1 12.7*  HCT 40.0 36.4*  MCV 77.8* 77.4*  PLT 231 221   Cardiac Enzymes:  Recent Labs Lab 02/21/13 1327 02/21/13 2019 02/22/13 0139 02/22/13 0740  TROPONINI <0.30 <0.30 <0.30 <0.30   BNP: BNP (last 3 results)  Recent Labs  02/21/13 2019  PROBNP 81.7   CBG:  Recent Labs Lab 02/22/13 1708 02/22/13 2024 02/23/13 0023 02/23/13 0445 02/23/13 0733  GLUCAP 79 142* 126* 93 94       Signed:  HERNANDEZ  ACOSTA,[name]  [facility] Pager: [phone] 02/23/2013, 10:48 AM

## 2013-03-10 ENCOUNTER — Ambulatory Visit (INDEPENDENT_AMBULATORY_CARE_PROVIDER_SITE_OTHER): Payer: Federal, State, Local not specified - PPO | Admitting: Physician Assistant

## 2013-03-10 ENCOUNTER — Other Ambulatory Visit: Payer: Self-pay | Admitting: *Deleted

## 2013-03-10 DIAGNOSIS — E669 Obesity, unspecified: Secondary | ICD-10-CM

## 2013-03-10 DIAGNOSIS — E1169 Type 2 diabetes mellitus with other specified complication: Secondary | ICD-10-CM

## 2013-03-10 DIAGNOSIS — R079 Chest pain, unspecified: Secondary | ICD-10-CM

## 2013-03-10 DIAGNOSIS — E119 Type 2 diabetes mellitus without complications: Secondary | ICD-10-CM

## 2013-03-10 NOTE — Progress Notes (Signed)
Exercise Treadmill Test  Pre-Exercise Testing Evaluation Rhythm: normal sinus  Rate: 72     Test  Exercise Tolerance Test Ordering MD: Dietrich Pates, MD  Interpreting MD: Tereso Newcomer, PA-C  Unique Test No: 1  Treadmill:  1  Indication for ETT: chest pain - rule out ischemia  Contraindication to ETT: No   Stress Modality: exercise - treadmill  Cardiac Imaging Performed: non   Protocol: standard Bruce - maximal  Max BP:  189/60  Max MPHR (bpm):  165 85% MPR (bpm):  140  MPHR obtained (bpm):  146 % MPHR obtained:  88  Reached 85% MPHR (min:sec):  5:04 Total Exercise Time (min-sec):  6:01  Workload in METS:  7.0 Borg Scale: 13  Reason ETT Terminated:  patient's desire to stop    ST Segment Analysis At Rest: normal ST segments - no evidence of significant ST depression With Exercise: no evidence of significant ST depression  Other Information Arrhythmia:  No Angina during ETT:  absent (0) Quality of ETT:  diagnostic  ETT Interpretation:  normal - no evidence of ischemia by ST analysis  Comments: Fair exercise tolerance. No chest pain. Normal BP response to exercise. No significant ST-T changes to suggest ischemia.   Recommendations: F/u with Dr. Dietrich Pates as directed. Signed, Tereso Newcomer, PA-C   03/10/2013 3:24 PM

## 2014-10-22 ENCOUNTER — Encounter (HOSPITAL_COMMUNITY): Payer: Self-pay | Admitting: *Deleted

## 2014-10-22 ENCOUNTER — Emergency Department (HOSPITAL_COMMUNITY)
Admission: EM | Admit: 2014-10-22 | Discharge: 2014-10-22 | Disposition: A | Discharge status: Home / Self Care | Payer: Federal, State, Local not specified - PPO | Attending: Emergency Medicine | Admitting: Emergency Medicine

## 2014-10-22 DIAGNOSIS — Z8719 Personal history of other diseases of the digestive system: Secondary | ICD-10-CM | POA: Diagnosis not present

## 2014-10-22 DIAGNOSIS — R0981 Nasal congestion: Secondary | ICD-10-CM | POA: Diagnosis not present

## 2014-10-22 DIAGNOSIS — R5383 Other fatigue: Secondary | ICD-10-CM | POA: Diagnosis present

## 2014-10-22 DIAGNOSIS — E1165 Type 2 diabetes mellitus with hyperglycemia: Secondary | ICD-10-CM | POA: Insufficient documentation

## 2014-10-22 DIAGNOSIS — R739 Hyperglycemia, unspecified: Secondary | ICD-10-CM

## 2014-10-22 LAB — CBC WITH DIFFERENTIAL/PLATELET
BASOS ABS: 0 10*3/uL (ref 0.0–0.1)
BASOS PCT: 0 % (ref 0–1)
EOS ABS: 0.1 10*3/uL (ref 0.0–0.7)
Eosinophils Relative: 2 % (ref 0–5)
HCT: 40 % (ref 39.0–52.0)
HEMOGLOBIN: 14.3 g/dL (ref 13.0–17.0)
LYMPHS ABS: 2.2 10*3/uL (ref 0.7–4.0)
LYMPHS PCT: 29 % (ref 12–46)
MCH: 26.7 pg (ref 26.0–34.0)
MCHC: 35.8 g/dL (ref 30.0–36.0)
MCV: 74.6 fL — AB (ref 78.0–100.0)
Monocytes Absolute: 0.6 10*3/uL (ref 0.1–1.0)
Monocytes Relative: 8 % (ref 3–12)
NEUTROS ABS: 4.7 10*3/uL (ref 1.7–7.7)
NEUTROS PCT: 61 % (ref 43–77)
Platelets: 220 10*3/uL (ref 150–400)
RBC: 5.36 MIL/uL (ref 4.22–5.81)
RDW: 14.7 % (ref 11.5–15.5)
WBC: 7.6 10*3/uL (ref 4.0–10.5)

## 2014-10-22 LAB — URINALYSIS, ROUTINE W REFLEX MICROSCOPIC
BILIRUBIN URINE: NEGATIVE
Glucose, UA: >1000 mg/dL — AB
Hgb urine dipstick: NEGATIVE
Ketones, ur: 15 mg/dL — AB
Leukocytes, UA: NEGATIVE
Nitrite: NEGATIVE
Protein, ur: NEGATIVE mg/dL
SPECIFIC GRAVITY, URINE: 1.028 (ref 1.005–1.030)
UROBILINOGEN UA: 0.2 mg/dL (ref 0.0–1.0)
pH: 5 (ref 5.0–8.0)

## 2014-10-22 LAB — COMPREHENSIVE METABOLIC PANEL WITH GFR
ALT: 26 U/L (ref 0–53)
AST: 20 U/L (ref 0–37)
Albumin: 3.9 g/dL (ref 3.5–5.2)
Alkaline Phosphatase: 105 U/L (ref 39–117)
Anion gap: 11 (ref 5–15)
BUN: 10 mg/dL (ref 6–23)
CO2: 24 mmol/L (ref 19–32)
Calcium: 9.3 mg/dL (ref 8.4–10.5)
Chloride: 100 mmol/L (ref 96–112)
Creatinine, Ser: 0.91 mg/dL (ref 0.50–1.35)
GFR calc Af Amer: >90 mL/min (ref >90)
GFR calc non Af Amer: >90 mL/min (ref >90)
Glucose, Bld: 515 mg/dL — ABNORMAL HIGH (ref 70–99)
Potassium: 3.8 mmol/L (ref 3.5–5.1)
Sodium: 135 mmol/L (ref 135–145)
Total Bilirubin: 0.7 mg/dL (ref 0.3–1.2)
Total Protein: 7.3 g/dL (ref 6.0–8.3)

## 2014-10-22 LAB — URINE MICROSCOPIC-ADD ON

## 2014-10-22 LAB — CBG MONITORING, ED
Glucose-Capillary: 447 mg/dL — ABNORMAL HIGH (ref 70–99)
Glucose-Capillary: 403 mg/dL — ABNORMAL HIGH (ref 70–99)
Glucose-Capillary: 264 mg/dL — ABNORMAL HIGH (ref 70–99)

## 2014-10-22 MED ORDER — LISINOPRIL 10 MG PO TABS: 10.0000 mg | ORAL_TABLET | Freq: Every day | ORAL | Status: AC

## 2014-10-22 MED ORDER — INSULIN ASPART 100 UNIT/ML ~~LOC~~ SOLN
10.0000 [IU] | Freq: Once | SUBCUTANEOUS | Status: AC
  Administered 2014-10-22: 10 [IU] via INTRAVENOUS
  Filled 2014-10-22: qty 1

## 2014-10-22 MED ORDER — SODIUM CHLORIDE 0.9 % IV BOLUS (SEPSIS)
1000.0000 mL | Freq: Once | INTRAVENOUS | Status: AC
  Administered 2014-10-22: 1000 mL via INTRAVENOUS

## 2014-10-22 MED ORDER — METFORMIN HCL 500 MG PO TABS: 500.0000 mg | ORAL_TABLET | Freq: Two times a day (BID) | ORAL | Status: AC

## 2014-10-22 NOTE — ED Notes (Signed)
CBG 264.Marland Kitchen.Marland Kitchen.Marland Kitchen.Nurse notified

## 2014-10-22 NOTE — ED Notes (Signed)
Dr. Pollina at bedside   

## 2014-10-22 NOTE — ED Notes (Signed)
The pt has been controlling his diabetes with diet.  He  Checked it today and it was 500.   He has had  Urinary frequency.  He is feeling tired with some blurred vision.

## 2014-10-22 NOTE — ED Notes (Signed)
Pt A&OX4, ambulatory at d/c with steady gait, NAD 

## 2014-10-22 NOTE — ED Provider Notes (Signed)
CSN: [id number]     Arrival date & time 10/22/14  1901 History   First MD Initiated Contact with Patient 10/22/14 2023     Chief Complaint  Patient presents with  . Hyperglycemia     (Consider location/radiation/quality/duration/timing/severity/associated sxs/prior Treatment) HPI Comments: Patient presents to the ER with concerns over elevated blood sugar. Patient does have a history of diabetes. He reports that he has not been taking medications for several years, controlled with diet. Patient has noticed blurred vision increased thirst and increased drinking, increased urination and has been feeling very tired. He has had some nasal congestion, no cough, chest pain, shortness of breath or fever. No vomiting or diarrhea. Blood sugar 447 in triage.  Patient is a 59 y.o. male presenting with hyperglycemia.  Hyperglycemia Associated symptoms: fatigue, increased thirst and polyuria     Past Medical History  Diagnosis Date  . Diabetes mellitus without complication     a. diet controlled (01/2013)  . Morbid obesity     a. s/p gastric bypass  . GERD (gastroesophageal reflux disease)    Past Surgical History  Procedure Laterality Date  . Gastric bypass      a. 2005  . Cholecystectomy      a. 2004   Family History  Problem Relation Age of Onset  . Stroke Mother     alive in her late 370's, cva in her 250's.  . Other Father     unaware of his medical hx.  . Other Other     1/2 brother with h/o enlarged heart and sudden death.   History  Substance Use Topics  . Smoking status: Never Smoker   . Smokeless tobacco: Not on file  . Alcohol Use: No    Review of Systems  Constitutional: Positive for fatigue.  HENT: Positive for congestion.   Eyes: Positive for visual disturbance.  Endocrine: Positive for polydipsia and polyuria.  All other systems reviewed and are negative.     Allergies  Review of patient's allergies indicates no known allergies.  Home Medications   Prior  to Admission medications   Medication Sig Start Date End Date Taking? Authorizing Provider  aspirin EC 325 MG EC tablet Take 1 tablet (325 mg total) by mouth daily. Patient not taking: Reported on 10/22/2014 02/23/13   Henderson CloudEstela Y [name], MD   BP 147/80 mmHg  Pulse 72  Temp(Src) 98.4 F (36.9 C) (Oral)  Resp 18  Ht 6\' 1"  (1.854 m)  Wt 320 lb (145.151 kg)  BMI 42.23 kg/m2  SpO2 96% Physical Exam  Constitutional: He is oriented to person, place, and time. He appears well-developed and well-nourished. No distress.  HENT:  Head: Normocephalic and atraumatic.  Right Ear: Hearing normal.  Left Ear: Hearing normal.  Nose: Nose normal.  Mouth/Throat: Oropharynx is clear and moist and mucous membranes are normal.  Eyes: Conjunctivae and EOM are normal. Pupils are equal, round, and reactive to light.  Neck: Normal range of motion. Neck supple.  Cardiovascular: Regular rhythm, S1 normal and S2 normal.  Exam reveals no gallop and no friction rub.   No murmur heard. Pulmonary/Chest: Effort normal and breath sounds normal. No respiratory distress. He exhibits no tenderness.  Abdominal: Soft. Normal appearance and bowel sounds are normal. There is no hepatosplenomegaly. There is no tenderness. There is no rebound, no guarding, no tenderness at McBurney's point and negative Murphy's sign. No hernia.  Musculoskeletal: Normal range of motion.  Neurological: He is alert and oriented to person, place,  and time. He has normal strength. No cranial nerve deficit or sensory deficit. Coordination normal. GCS eye subscore is 4. GCS verbal subscore is 5. GCS motor subscore is 6.  Skin: Skin is warm, dry and intact. No rash noted. No cyanosis.  Psychiatric: He has a normal mood and affect. His speech is normal and behavior is normal. Thought content normal.  Nursing note and vitals reviewed.   ED Course  Procedures (including critical care time) Labs Review Labs Reviewed  CBC WITH  DIFFERENTIAL/PLATELET - Abnormal; Notable for the following:    MCV 74.6 (*)    All other components within normal limits  URINALYSIS, ROUTINE W REFLEX MICROSCOPIC - Abnormal; Notable for the following:    Glucose, UA >1000 (*)    Ketones, ur 15 (*)    All other components within normal limits  CBG MONITORING, ED - Abnormal; Notable for the following:    Glucose-Capillary 447 (*)    All other components within normal limits  URINE MICROSCOPIC-ADD ON  COMPREHENSIVE METABOLIC PANEL    Imaging Review No results found.   EKG Interpretation None      MDM   Final diagnoses:  Hyperglycemia without ketosis    Patient with previous history of diabetes, diet controlled, presents to the ER with hyperglycemia without evidence of ketosis. Lab work unremarkable. Examination unremarkable. No obvious precipitating event, although patient does have some cold symptoms. Lungs are clear. Abdominal exam is benign. Patient treated with insulin and IV fluids, will need to reinitiate anti-glycemic agents. Patient reports that he does have a primary care physician to follow-up with this week.    Gilda Crease, MD 10/22/14 2046

## 2014-10-22 NOTE — Discharge Instructions (Signed)

## 2016-01-22 DIAGNOSIS — E119 Type 2 diabetes mellitus without complications: Secondary | ICD-10-CM | POA: Diagnosis not present

## 2016-01-22 DIAGNOSIS — Z125 Encounter for screening for malignant neoplasm of prostate: Secondary | ICD-10-CM | POA: Diagnosis not present

## 2016-01-22 DIAGNOSIS — J309 Allergic rhinitis, unspecified: Secondary | ICD-10-CM | POA: Diagnosis not present

## 2016-01-22 DIAGNOSIS — Z0000 Encounter for general adult medical examination without abnormal findings: Secondary | ICD-10-CM | POA: Diagnosis not present

## 2016-01-22 DIAGNOSIS — R252 Cramp and spasm: Secondary | ICD-10-CM | POA: Diagnosis not present

## 2016-08-21 DIAGNOSIS — E119 Type 2 diabetes mellitus without complications: Secondary | ICD-10-CM | POA: Diagnosis not present

## 2016-08-21 DIAGNOSIS — K219 Gastro-esophageal reflux disease without esophagitis: Secondary | ICD-10-CM | POA: Diagnosis not present

## 2016-08-21 DIAGNOSIS — E559 Vitamin D deficiency, unspecified: Secondary | ICD-10-CM | POA: Diagnosis not present

## 2016-08-29 DIAGNOSIS — R0789 Other chest pain: Secondary | ICD-10-CM | POA: Diagnosis not present

## 2017-01-24 DIAGNOSIS — E119 Type 2 diabetes mellitus without complications: Secondary | ICD-10-CM | POA: Diagnosis not present

## 2017-01-24 DIAGNOSIS — E7800 Pure hypercholesterolemia, unspecified: Secondary | ICD-10-CM | POA: Diagnosis not present

## 2017-01-24 DIAGNOSIS — J309 Allergic rhinitis, unspecified: Secondary | ICD-10-CM | POA: Diagnosis not present

## 2017-01-24 DIAGNOSIS — E559 Vitamin D deficiency, unspecified: Secondary | ICD-10-CM | POA: Diagnosis not present

## 2017-01-24 DIAGNOSIS — Z0000 Encounter for general adult medical examination without abnormal findings: Secondary | ICD-10-CM | POA: Diagnosis not present

## 2017-07-29 DIAGNOSIS — K219 Gastro-esophageal reflux disease without esophagitis: Secondary | ICD-10-CM | POA: Diagnosis not present

## 2017-07-29 DIAGNOSIS — E119 Type 2 diabetes mellitus without complications: Secondary | ICD-10-CM | POA: Diagnosis not present

## 2017-07-29 DIAGNOSIS — J309 Allergic rhinitis, unspecified: Secondary | ICD-10-CM | POA: Diagnosis not present

## 2017-11-28 DIAGNOSIS — E119 Type 2 diabetes mellitus without complications: Secondary | ICD-10-CM | POA: Diagnosis not present

## 2018-12-15 DIAGNOSIS — E119 Type 2 diabetes mellitus without complications: Secondary | ICD-10-CM | POA: Diagnosis not present

## 2018-12-15 DIAGNOSIS — J309 Allergic rhinitis, unspecified: Secondary | ICD-10-CM | POA: Diagnosis not present

## 2018-12-15 DIAGNOSIS — K219 Gastro-esophageal reflux disease without esophagitis: Secondary | ICD-10-CM | POA: Diagnosis not present

## 2019-02-19 DIAGNOSIS — Z0000 Encounter for general adult medical examination without abnormal findings: Secondary | ICD-10-CM | POA: Diagnosis not present

## 2019-05-27 DIAGNOSIS — E7800 Pure hypercholesterolemia, unspecified: Secondary | ICD-10-CM | POA: Diagnosis not present

## 2019-05-27 DIAGNOSIS — K219 Gastro-esophageal reflux disease without esophagitis: Secondary | ICD-10-CM | POA: Diagnosis not present

## 2019-05-27 DIAGNOSIS — E1169 Type 2 diabetes mellitus with other specified complication: Secondary | ICD-10-CM | POA: Diagnosis not present

## 2019-05-27 DIAGNOSIS — Z79899 Other long term (current) drug therapy: Secondary | ICD-10-CM | POA: Diagnosis not present

## 2019-06-16 DIAGNOSIS — E7800 Pure hypercholesterolemia, unspecified: Secondary | ICD-10-CM | POA: Diagnosis not present

## 2019-06-16 DIAGNOSIS — E1169 Type 2 diabetes mellitus with other specified complication: Secondary | ICD-10-CM | POA: Diagnosis not present

## 2019-06-21 DIAGNOSIS — S46819A Strain of other muscles, fascia and tendons at shoulder and upper arm level, unspecified arm, initial encounter: Secondary | ICD-10-CM | POA: Diagnosis not present

## 2019-09-15 DIAGNOSIS — E7800 Pure hypercholesterolemia, unspecified: Secondary | ICD-10-CM | POA: Diagnosis not present

## 2019-09-15 DIAGNOSIS — J309 Allergic rhinitis, unspecified: Secondary | ICD-10-CM | POA: Diagnosis not present

## 2019-09-15 DIAGNOSIS — E1169 Type 2 diabetes mellitus with other specified complication: Secondary | ICD-10-CM | POA: Diagnosis not present

## 2019-11-24 DIAGNOSIS — E7800 Pure hypercholesterolemia, unspecified: Secondary | ICD-10-CM | POA: Diagnosis not present

## 2019-11-24 DIAGNOSIS — J309 Allergic rhinitis, unspecified: Secondary | ICD-10-CM | POA: Diagnosis not present

## 2019-11-24 DIAGNOSIS — E1169 Type 2 diabetes mellitus with other specified complication: Secondary | ICD-10-CM | POA: Diagnosis not present

## 2020-05-26 DIAGNOSIS — E7800 Pure hypercholesterolemia, unspecified: Secondary | ICD-10-CM | POA: Diagnosis not present

## 2020-05-26 DIAGNOSIS — J309 Allergic rhinitis, unspecified: Secondary | ICD-10-CM | POA: Diagnosis not present

## 2020-05-26 DIAGNOSIS — E1169 Type 2 diabetes mellitus with other specified complication: Secondary | ICD-10-CM | POA: Diagnosis not present

## 2020-06-06 DIAGNOSIS — R109 Unspecified abdominal pain: Secondary | ICD-10-CM | POA: Diagnosis not present

## 2020-06-07 ENCOUNTER — Other Ambulatory Visit (HOSPITAL_BASED_OUTPATIENT_CLINIC_OR_DEPARTMENT_OTHER): Payer: Self-pay | Admitting: Family Medicine

## 2020-06-07 DIAGNOSIS — R109 Unspecified abdominal pain: Secondary | ICD-10-CM

## 2020-06-07 DIAGNOSIS — R509 Fever, unspecified: Secondary | ICD-10-CM

## 2020-06-13 ENCOUNTER — Other Ambulatory Visit: Payer: Self-pay

## 2020-06-13 ENCOUNTER — Ambulatory Visit (HOSPITAL_BASED_OUTPATIENT_CLINIC_OR_DEPARTMENT_OTHER)
Admission: RE | Admit: 2020-06-13 | Discharge: 2020-06-13 | Disposition: A | Discharge status: Home / Self Care | Payer: Federal, State, Local not specified - PPO | Source: Ambulatory Visit | Attending: Family Medicine | Admitting: Family Medicine

## 2020-06-13 DIAGNOSIS — Z9049 Acquired absence of other specified parts of digestive tract: Secondary | ICD-10-CM | POA: Diagnosis not present

## 2020-06-13 DIAGNOSIS — R509 Fever, unspecified: Secondary | ICD-10-CM | POA: Insufficient documentation

## 2020-06-13 DIAGNOSIS — K5730 Diverticulosis of large intestine without perforation or abscess without bleeding: Secondary | ICD-10-CM | POA: Diagnosis not present

## 2020-06-13 DIAGNOSIS — R109 Unspecified abdominal pain: Secondary | ICD-10-CM | POA: Insufficient documentation

## 2020-06-13 DIAGNOSIS — M7989 Other specified soft tissue disorders: Secondary | ICD-10-CM | POA: Diagnosis not present

## 2020-06-13 MED ORDER — IOHEXOL 300 MG/ML  SOLN
100.0000 mL | Freq: Once | INTRAMUSCULAR | Status: AC | PRN
  Administered 2020-06-13: 100 mL via INTRAVENOUS

## 2020-07-10 DIAGNOSIS — Z1159 Encounter for screening for other viral diseases: Secondary | ICD-10-CM | POA: Diagnosis not present

## 2020-07-12 DIAGNOSIS — Z86010 Personal history of colonic polyps: Secondary | ICD-10-CM | POA: Diagnosis not present

## 2020-07-12 DIAGNOSIS — K635 Polyp of colon: Secondary | ICD-10-CM | POA: Diagnosis not present

## 2020-07-12 DIAGNOSIS — D120 Benign neoplasm of cecum: Secondary | ICD-10-CM | POA: Diagnosis not present

## 2020-07-12 DIAGNOSIS — D122 Benign neoplasm of ascending colon: Secondary | ICD-10-CM | POA: Diagnosis not present

## 2020-07-12 DIAGNOSIS — K5730 Diverticulosis of large intestine without perforation or abscess without bleeding: Secondary | ICD-10-CM | POA: Diagnosis not present

## 2020-11-23 DIAGNOSIS — E7800 Pure hypercholesterolemia, unspecified: Secondary | ICD-10-CM | POA: Diagnosis not present

## 2020-11-23 DIAGNOSIS — D649 Anemia, unspecified: Secondary | ICD-10-CM | POA: Diagnosis not present

## 2020-11-23 DIAGNOSIS — E1169 Type 2 diabetes mellitus with other specified complication: Secondary | ICD-10-CM | POA: Diagnosis not present

## 2020-11-23 DIAGNOSIS — J309 Allergic rhinitis, unspecified: Secondary | ICD-10-CM | POA: Diagnosis not present

## 2020-11-23 DIAGNOSIS — Z7984 Long term (current) use of oral hypoglycemic drugs: Secondary | ICD-10-CM | POA: Diagnosis not present

## 2021-05-10 DIAGNOSIS — M545 Low back pain, unspecified: Secondary | ICD-10-CM | POA: Diagnosis not present

## 2021-05-10 DIAGNOSIS — Z0000 Encounter for general adult medical examination without abnormal findings: Secondary | ICD-10-CM | POA: Diagnosis not present

## 2021-05-10 DIAGNOSIS — Z125 Encounter for screening for malignant neoplasm of prostate: Secondary | ICD-10-CM | POA: Diagnosis not present

## 2021-05-10 DIAGNOSIS — E1169 Type 2 diabetes mellitus with other specified complication: Secondary | ICD-10-CM | POA: Diagnosis not present

## 2021-05-10 DIAGNOSIS — Z23 Encounter for immunization: Secondary | ICD-10-CM | POA: Diagnosis not present

## 2021-05-10 DIAGNOSIS — E7800 Pure hypercholesterolemia, unspecified: Secondary | ICD-10-CM | POA: Diagnosis not present

## 2021-05-30 DIAGNOSIS — J309 Allergic rhinitis, unspecified: Secondary | ICD-10-CM | POA: Diagnosis not present

## 2021-05-30 DIAGNOSIS — Z7984 Long term (current) use of oral hypoglycemic drugs: Secondary | ICD-10-CM | POA: Diagnosis not present

## 2021-05-30 DIAGNOSIS — Z23 Encounter for immunization: Secondary | ICD-10-CM | POA: Diagnosis not present

## 2021-05-30 DIAGNOSIS — E1169 Type 2 diabetes mellitus with other specified complication: Secondary | ICD-10-CM | POA: Diagnosis not present

## 2021-05-30 DIAGNOSIS — E7800 Pure hypercholesterolemia, unspecified: Secondary | ICD-10-CM | POA: Diagnosis not present

## 2021-06-29 DIAGNOSIS — M5451 Vertebrogenic low back pain: Secondary | ICD-10-CM | POA: Diagnosis not present

## 2021-12-05 DIAGNOSIS — E1169 Type 2 diabetes mellitus with other specified complication: Secondary | ICD-10-CM | POA: Diagnosis not present

## 2021-12-05 DIAGNOSIS — J309 Allergic rhinitis, unspecified: Secondary | ICD-10-CM | POA: Diagnosis not present

## 2021-12-05 DIAGNOSIS — Z7984 Long term (current) use of oral hypoglycemic drugs: Secondary | ICD-10-CM | POA: Diagnosis not present

## 2021-12-05 DIAGNOSIS — E7800 Pure hypercholesterolemia, unspecified: Secondary | ICD-10-CM | POA: Diagnosis not present

## 2021-12-05 DIAGNOSIS — Z8701 Personal history of pneumonia (recurrent): Secondary | ICD-10-CM | POA: Diagnosis not present

## 2022-05-28 DIAGNOSIS — Z7984 Long term (current) use of oral hypoglycemic drugs: Secondary | ICD-10-CM | POA: Diagnosis not present

## 2022-05-28 DIAGNOSIS — E7800 Pure hypercholesterolemia, unspecified: Secondary | ICD-10-CM | POA: Diagnosis not present

## 2022-05-28 DIAGNOSIS — J309 Allergic rhinitis, unspecified: Secondary | ICD-10-CM | POA: Diagnosis not present

## 2022-05-28 DIAGNOSIS — E1169 Type 2 diabetes mellitus with other specified complication: Secondary | ICD-10-CM | POA: Diagnosis not present

## 2022-06-04 IMAGING — CT CT ABD-PELV W/ CM
2 of 5 series · 16 of 46 positions shown, 18 images · IV contrast (Omnipaque)
Comparison: None.

CLINICAL DATA: Right-sided abdominal/flank pain with fever for 1
month.

EXAM:
CT ABDOMEN AND PELVIS WITH CONTRAST
TECHNIQUE: Multidetector CT imaging of the abdomen and pelvis was performed
using the standard protocol following bolus administration of
intravenous contrast.
CONTRAST:  100mL OMNIPAQUE IOHEXOL 300 MG/ML  SOLN

[Series 2: axial st · axial · 0.98mm/px · z∈[-674,-124]mm · 13 of 124 slices shown, 15 images]
[im 7/124  soft-tissue]
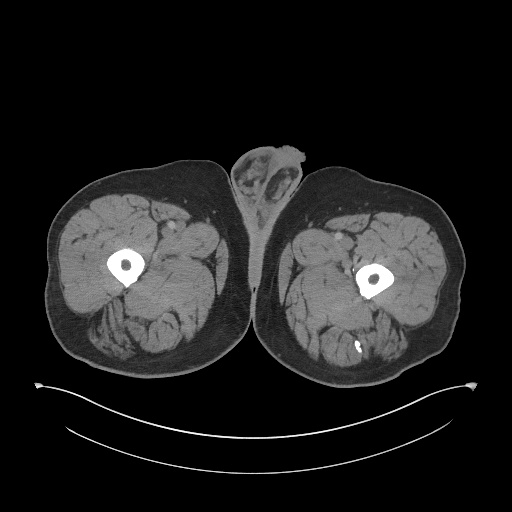
[im 7/124  bone]
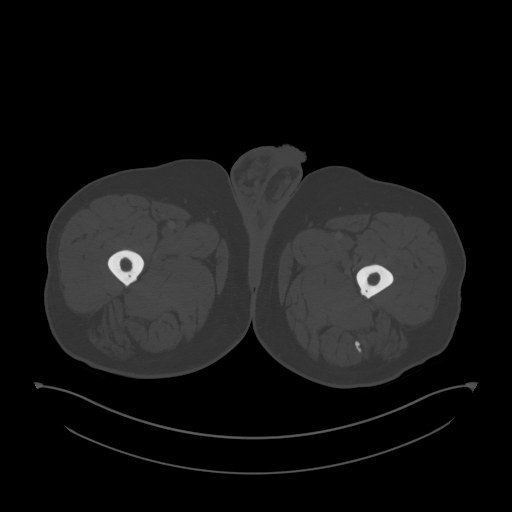
[im 19/124  soft-tissue]
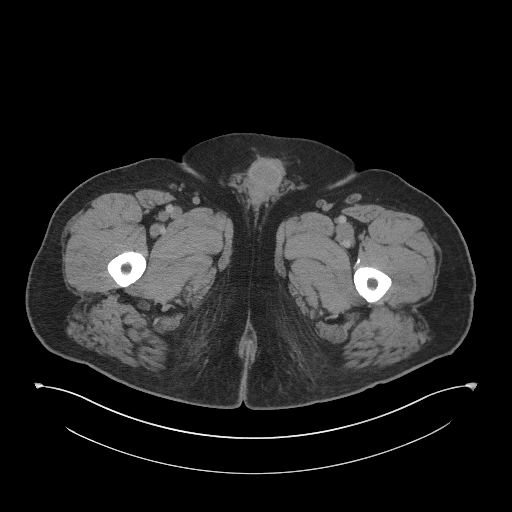
[im 25/124  soft-tissue]
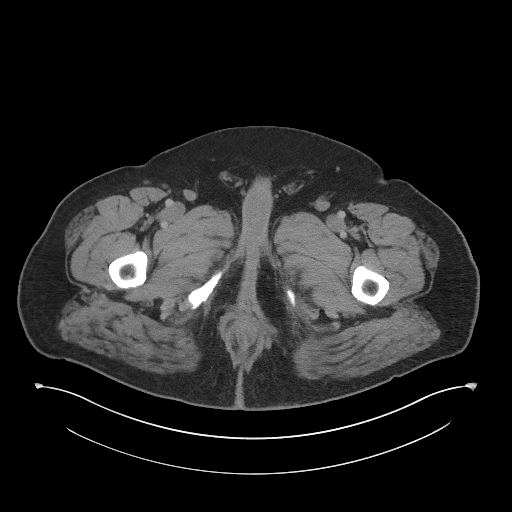
[im 37/124  soft-tissue]
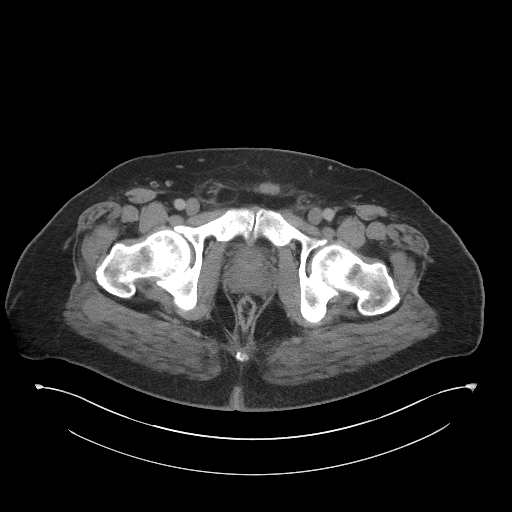
[im 44/124  soft-tissue]
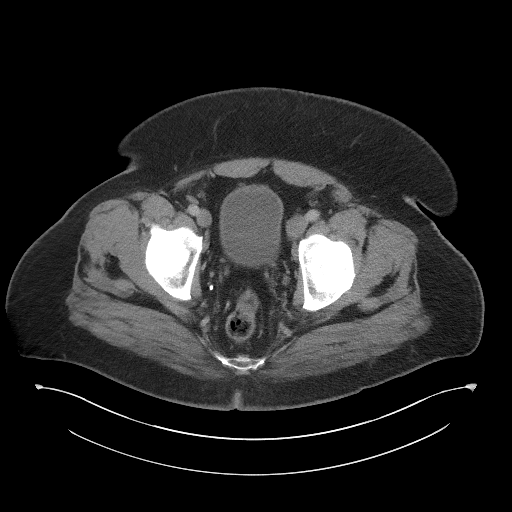
[im 56/124  soft-tissue]
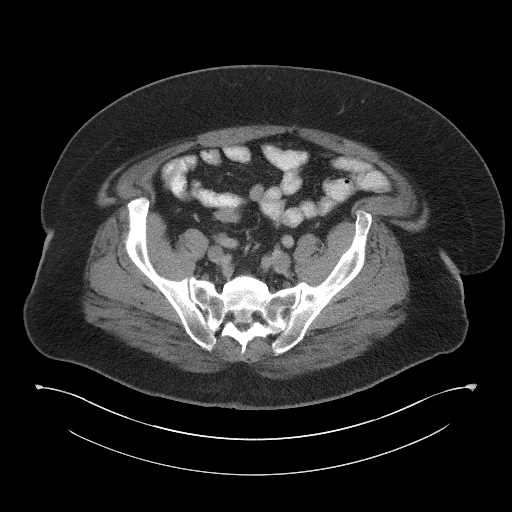
[im 62/124  soft-tissue]
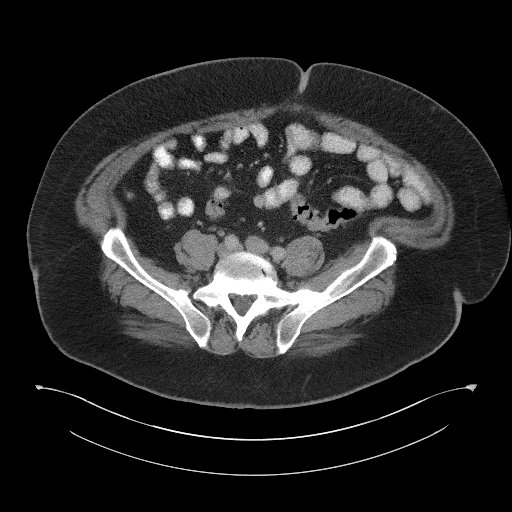
[im 68/124  soft-tissue]
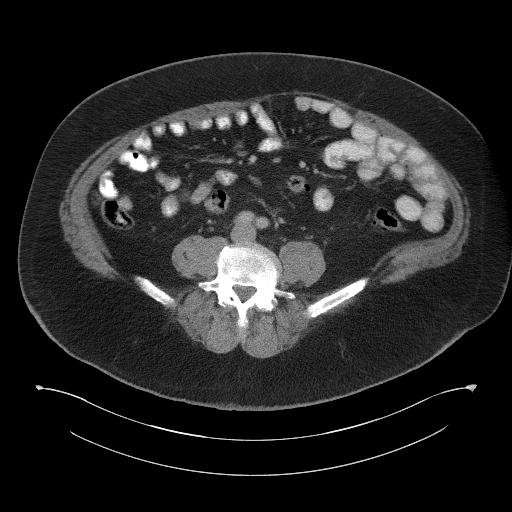
[im 80/124  soft-tissue]
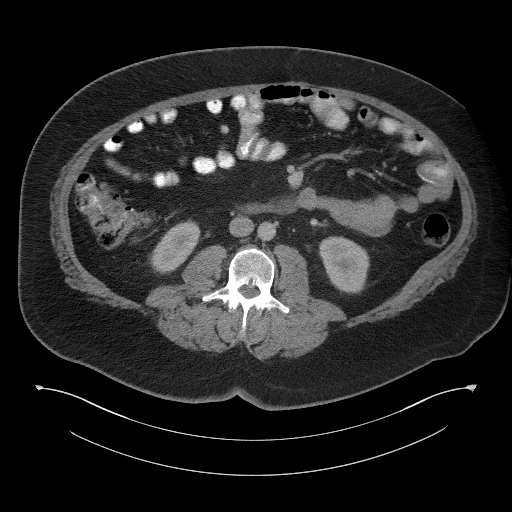
[im 80/124  bone]
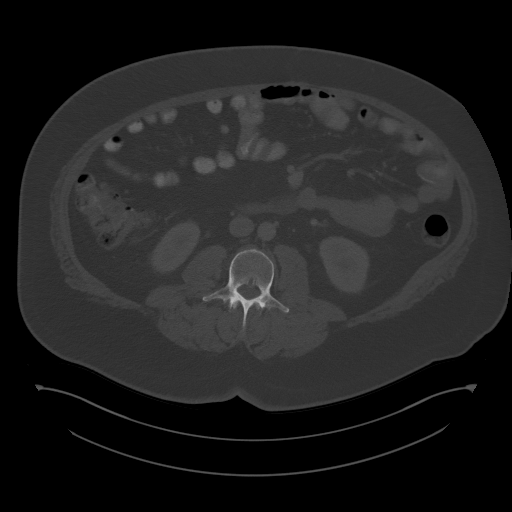
[im 87/124  soft-tissue]
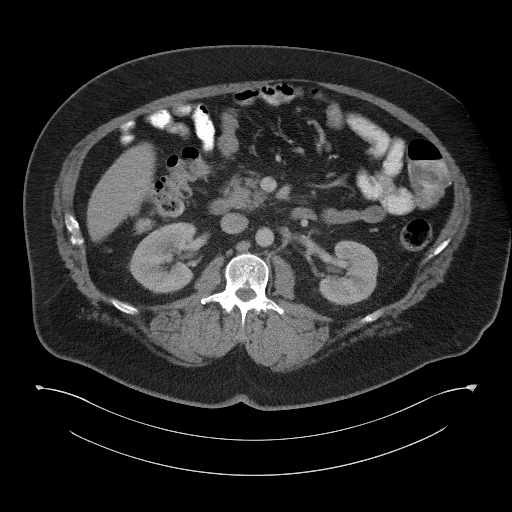
[im 99/124  soft-tissue]
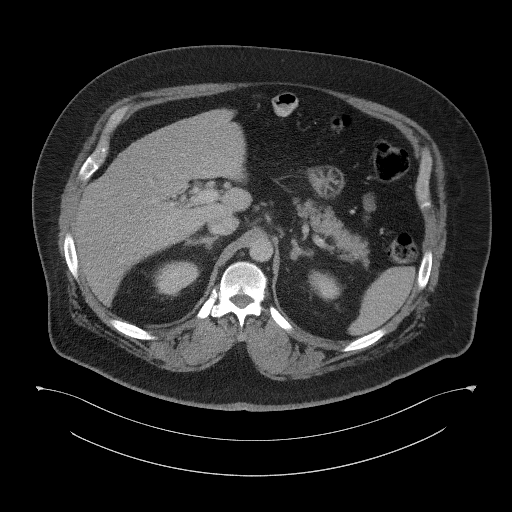
[im 105/124  soft-tissue]
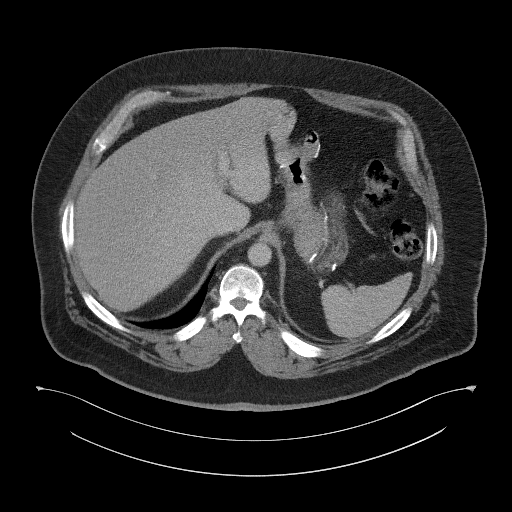
[im 117/124  soft-tissue]
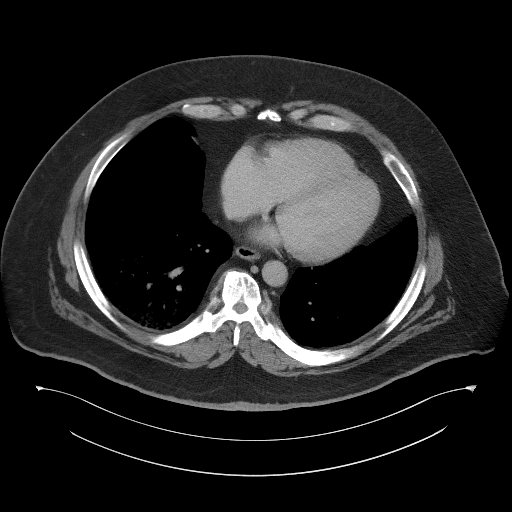

[Series 4: coronal st · coronal · 1.08mm/px · 3 of 124 slices shown]
[im 42/124  soft-tissue]
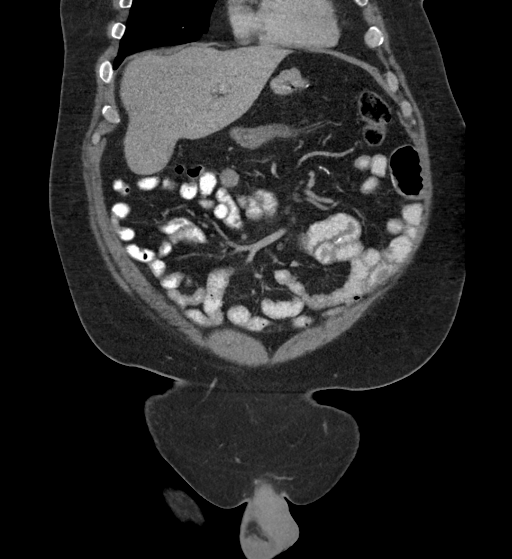
[im 55/124  soft-tissue]
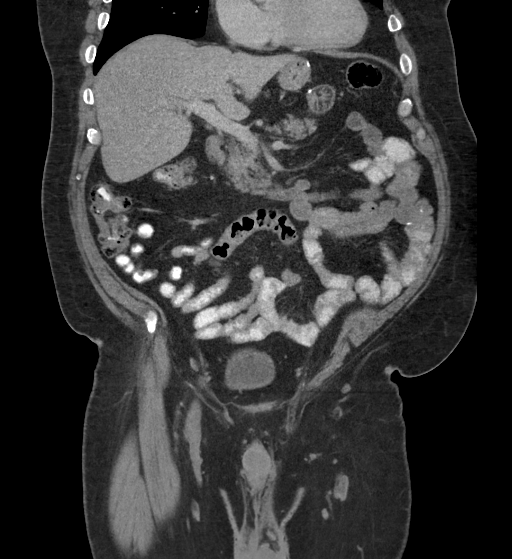
[im 69/124  soft-tissue]
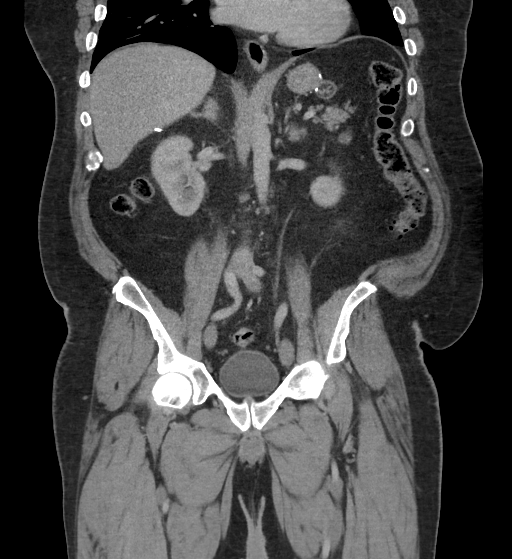

[16 of 46 positions shown; findings below may reference images not displayed]

FINDINGS: Lower chest: Patchy opacity predominantly posteriorly in the right
lower lobe. Mild peribronchovascular opacity in the right middle
lobe. 4 mm subpleural nodule anteriorly in the right lower lobe
(series 6, image 13). No pleural effusion. Normal heart size.

Hepatobiliary: No focal liver abnormality is seen. Status post
cholecystectomy. No biliary dilatation.

Pancreas: Unremarkable.

Spleen: Unremarkable.

Adrenals/Urinary Tract: Unremarkable adrenal glands. 1.1 cm
hypodensity in the right kidney compatible with a cyst. No renal
calculi or hydronephrosis. Unremarkable bladder.

Stomach/Bowel: Sequelae of gastric bypass are identified. There is
no evidence of bowel obstruction or inflammation. Mild sigmoid colon
diverticulosis is noted without evidence of diverticulitis. The
appendix is unremarkable.

Vascular/Lymphatic: No significant vascular findings are present. No
enlarged abdominal or pelvic lymph nodes.

Reproductive: Unremarkable prostate.

Other: No ascites or pneumoperitoneum.

Musculoskeletal: No acute osseous abnormality or suspicious osseous
lesion. Scattered calcifications in the soft tissues/musculature of
the proximal thighs.
IMPRESSION: 1. No acute abnormality identified in the abdomen or pelvis.
2. Patchy right lower lobe and right middle lobe opacities
consistent with pneumonia.
3. 4 mm right lower lobe pulmonary nodule. No follow-up needed if
patient is low-risk. Non-contrast chest CT can be considered in 12
months if patient is high-risk. This recommendation follows the
consensus statement: Guidelines for Management of Incidental
Pulmonary Nodules Detected on CT Images: From the [HOSPITAL]

## 2022-07-11 DIAGNOSIS — Z23 Encounter for immunization: Secondary | ICD-10-CM | POA: Diagnosis not present

## 2022-07-11 DIAGNOSIS — E1169 Type 2 diabetes mellitus with other specified complication: Secondary | ICD-10-CM | POA: Diagnosis not present

## 2022-09-19 DIAGNOSIS — Z7984 Long term (current) use of oral hypoglycemic drugs: Secondary | ICD-10-CM | POA: Diagnosis not present

## 2022-09-19 DIAGNOSIS — J309 Allergic rhinitis, unspecified: Secondary | ICD-10-CM | POA: Diagnosis not present

## 2022-09-19 DIAGNOSIS — E7800 Pure hypercholesterolemia, unspecified: Secondary | ICD-10-CM | POA: Diagnosis not present

## 2022-09-19 DIAGNOSIS — E1169 Type 2 diabetes mellitus with other specified complication: Secondary | ICD-10-CM | POA: Diagnosis not present

## 2023-02-26 DIAGNOSIS — M25552 Pain in left hip: Secondary | ICD-10-CM | POA: Diagnosis not present

## 2023-03-04 DIAGNOSIS — E1169 Type 2 diabetes mellitus with other specified complication: Secondary | ICD-10-CM | POA: Diagnosis not present

## 2023-03-04 DIAGNOSIS — Z125 Encounter for screening for malignant neoplasm of prostate: Secondary | ICD-10-CM | POA: Diagnosis not present

## 2023-03-04 DIAGNOSIS — Z23 Encounter for immunization: Secondary | ICD-10-CM | POA: Diagnosis not present

## 2023-03-04 DIAGNOSIS — E7800 Pure hypercholesterolemia, unspecified: Secondary | ICD-10-CM | POA: Diagnosis not present

## 2023-03-04 DIAGNOSIS — Z0000 Encounter for general adult medical examination without abnormal findings: Secondary | ICD-10-CM | POA: Diagnosis not present

## 2023-03-04 DIAGNOSIS — K219 Gastro-esophageal reflux disease without esophagitis: Secondary | ICD-10-CM | POA: Diagnosis not present

## 2023-09-03 DIAGNOSIS — E7800 Pure hypercholesterolemia, unspecified: Secondary | ICD-10-CM | POA: Diagnosis not present

## 2023-09-03 DIAGNOSIS — Z23 Encounter for immunization: Secondary | ICD-10-CM | POA: Diagnosis not present

## 2023-09-03 DIAGNOSIS — K219 Gastro-esophageal reflux disease without esophagitis: Secondary | ICD-10-CM | POA: Diagnosis not present

## 2023-09-03 DIAGNOSIS — M25571 Pain in right ankle and joints of right foot: Secondary | ICD-10-CM | POA: Diagnosis not present

## 2023-09-03 DIAGNOSIS — E1169 Type 2 diabetes mellitus with other specified complication: Secondary | ICD-10-CM | POA: Diagnosis not present

## 2023-09-05 DIAGNOSIS — M25571 Pain in right ankle and joints of right foot: Secondary | ICD-10-CM | POA: Diagnosis not present

## 2024-04-02 ENCOUNTER — Ambulatory Visit: Admitting: Podiatry

## 2024-04-02 ENCOUNTER — Ambulatory Visit (INDEPENDENT_AMBULATORY_CARE_PROVIDER_SITE_OTHER)

## 2024-04-02 DIAGNOSIS — L600 Ingrowing nail: Secondary | ICD-10-CM

## 2024-04-02 DIAGNOSIS — M25572 Pain in left ankle and joints of left foot: Secondary | ICD-10-CM | POA: Diagnosis not present

## 2024-04-02 DIAGNOSIS — M21622 Bunionette of left foot: Secondary | ICD-10-CM

## 2024-04-02 MED ORDER — TRIAMCINOLONE ACETONIDE 10 MG/ML IJ SUSP
10.0000 mg | Freq: Once | INTRAMUSCULAR | Status: AC
  Administered 2024-04-02: 10 mg

## 2024-04-02 NOTE — Progress Notes (Signed)
 Patient presents with multiple complaints.  Complaint of painful ingrown nails on both borders of the hallux bilaterally.  These [place] bothering him for years.  He is a diabetic.  Also complains of pain at the fifth metatarsal phalangeal joint of the left foot with tenderness with walking and wearing shoes.  Has been getting increasingly more uncomfortable.  Does not recall any injury to the area   Physical exam:  General appearance: Pleasant, and in no acute distress. AOx3.  Vascular: Pedal pulses: DP 2/4 bilaterally, PT 2/4 bilaterally.  Mild edema lower legs bilaterally. Capillary fill time immediate bilaterally.  Neurological: Light touch intact feet bilaterally.  Normal Achilles reflex bilaterally.  No clonus or spasticity noted.   Dermatologic:   Ingrown medial and lateral nail borders hallux nails bilaterally.  Redness along nail folds with hypertrophy of nail fold.  Tenderness with pressure skin normal temperature bilaterally.  Skin normal color, tone, and texture bilaterally.   Musculoskeletal: Tailors bunion deformity left with prominence of fifth metatarsal head plantar laterally.  Tenderness with range of motion of the fifth MTP left.  Radiographs: 3 views left foot: There was bunion deformity with lateral bowing of the fifth metatarsal.  No evidence any fractures or dislocations.  Subungual spur on the distal phalanx of the hallux.  No evidence fractures or dislocations.  Normal bone density.  Diagnosis: 1.  Ingrown nails hallux bilaterally 2.  Tailor's bunion deformity left 3.  Arthralgia fifth metatarsal phalangeal joint left  Plan: -New patient office visit for evaluation and management level 3.  Modifier 25 - Discussed with patient the ingrown nails on the hallux bilaterally.  Discussed conservative or surgical treatments.  Recommended matricectomy nail borders hallux bilaterally. -Discussed tailor's bunion deformities.  Discussed etiology and treatment.  Will try  some PowerStep OTC orthotics and see how he does with these.  Also discussed proper shoe gear and socks. -injected 2cc mixture of 1cc 0.5% Marcaine, 0.5cc Kenolog 10mg /47ml, and 0.5cc Dexamethasone sodium phosphate USP at fifth MTP left -Dispensed OTC foot orthotics bilaterally.     Return 2 weeks follow-up injection left and matrixectomy nail borders hallux right, then hallux left

## 2024-04-16 ENCOUNTER — Encounter: Payer: Self-pay | Admitting: Podiatry

## 2024-04-16 ENCOUNTER — Ambulatory Visit: Admitting: Podiatry

## 2024-04-16 DIAGNOSIS — M7752 Other enthesopathy of left foot: Secondary | ICD-10-CM

## 2024-04-16 MED ORDER — TRIAMCINOLONE ACETONIDE 10 MG/ML IJ SUSP: 10.0000 mg | Freq: Once | INTRAMUSCULAR | Status: AC

## 2024-04-16 NOTE — Progress Notes (Signed)
 Patient presents status post steroid injection at the fifth MTP left foot.  Helped for a few days but then pain started returning.  It is about back to about the same levels for him this is bothering him more than the ingrown nail on the hallux right right now.   Physical exam:  General appearance: Pleasant, and in no acute distress. AOx3.  Vascular: Pedal pulses: DP 2/4 bilaterally, PT 2/4 bilaterally.  Mild edema lower legs bilaterally. Capillary fill time immediate bilaterally.  Neurological: Grossly intact bilaterally  Dermatologic:   Skin normal temperature bilaterally.  Skin normal color, tone, and texture bilaterally.   Musculoskeletal: Tenderness on the plantar lateral and plantar aspect of the fifth metatarsal phalangeal joint left.  No tenderness to range of motion of the fifth MTP today.  No tenderness more proximally along the metatarsal shaft.   Diagnosis: 1.  Bursitis fifth MTP left  Plan: -Will try second injection around the fifth MTP left today.  I will hold off on the nail surgery on the right for right now until we get this under control. -injected 2cc mixture of 1cc 0.5% Marcaine, 0.5cc Kenolog 10mg /101ml, and 0.5cc Dexamethasone sodium phosphate USP at [facility] plantar and plantar lateral aspect fifth MTP left.     Return 2 weeks follow-up injection

## 2024-04-30 ENCOUNTER — Encounter: Payer: Self-pay | Admitting: Podiatry

## 2024-04-30 ENCOUNTER — Ambulatory Visit: Admitting: Podiatry

## 2024-04-30 DIAGNOSIS — M21622 Bunionette of left foot: Secondary | ICD-10-CM

## 2024-04-30 DIAGNOSIS — L600 Ingrowing nail: Secondary | ICD-10-CM

## 2024-04-30 DIAGNOSIS — M778 Other enthesopathies, not elsewhere classified: Secondary | ICD-10-CM | POA: Diagnosis not present

## 2024-04-30 NOTE — Patient Instructions (Signed)

## 2024-04-30 NOTE — Progress Notes (Signed)
 Patient presents today follow-up injection fifth MTP left.  Less painful than before but still bothering him quite a bit.  Patient also complains of painful ingrown both border(s) toe 1 right. Patient denies fevers, chills, nausea, vomiting.  Objective:  Vitals: Reviewed  General: Well developed, nourished, in no acute distress, alert and oriented x3   Vascular: DP pulse 2/4 bilateral. PT pulse 2/4 bilateral.  Mild to moderate edema hallux right.  Dermatology: Erythema, edema, incurvated nail border hallux right with mild clear drainage . Tenderness present with palpation. Normal skin tone and texture feet with normal hair growth.  Neurological: Grossly intact. Normal reflexes.   Musculoskeletal: Tenderness with palpation of the distal hallux right. No tenderness or painful ROM at IPJ.  Tenderness of the plantar and plantar medial aspect of the fifth metatarsal phalangeal joint left.  No tenderness with range of motion of fifth MPJ.  Diagnosis: 1.  Capsulitis fifth MTP left  2. Tailor's bunion deformity left. 2.  Ingrown nail both borders hallux right  Plan: -Established office visit for evaluation and management level 3.  Modifier 25. -Office visit today unrelated to ingrown nail on the right.  Discussed with him the continued pain at the tailor's bunion deformity on the fifth MTP left.  Has had mild improvement with the injection with 2 injections but it still painful for him.  He is wearing supportive roomy shoes today.  Given the amount of discomfort he is having with it at this point would recommend surgical correction of underlying tailor's bunion deformity, otherwise this will be a chronic issue. -Schedule for surgical consult correction tailor's bunion deformity left  -discussed etiology and treatment of ingrown nails. Discussed surgical vs conservative treatment. -Consent signed for appropriate matrixectomy affected nail(s). -Rx: Keflex 500 mg 2 p.o. twice daily for 7  days  Procedure(s):   - Matrixectomy(s) both borders hallux right: Toe anesthetized with 3cc 2:1 mixture 2% Lidocaine with epinephrine: Sodium Bicarbonate. Surgical site prepped. Digital tourniquet applied.  Avulsion of nail borders. performed. Matrixecomy performed with three 30 second applications of phenol to nail matrix. Site irrigated with alcohol.  Tourniquet released with good vascularity noticed in digit.  Applied triple antibiotic to nailbed and applied gauze and Coban dressing. - Written and oral postoperative instructions given.  -Return for post-op 2 weeks. -Schedule for next available surgical consult for correction tailor's bunion left  J Prentice Binder, DPM

## 2024-05-19 ENCOUNTER — Encounter: Payer: Self-pay | Admitting: Podiatry

## 2024-05-19 ENCOUNTER — Ambulatory Visit (INDEPENDENT_AMBULATORY_CARE_PROVIDER_SITE_OTHER): Admitting: Podiatry

## 2024-05-19 ENCOUNTER — Ambulatory Visit: Admitting: Podiatry

## 2024-05-19 VITALS — Ht 73.0 in | Wt 320.0 lb

## 2024-05-19 DIAGNOSIS — M21622 Bunionette of left foot: Secondary | ICD-10-CM

## 2024-05-19 DIAGNOSIS — M2012 Hallux valgus (acquired), left foot: Secondary | ICD-10-CM | POA: Diagnosis not present

## 2024-05-19 DIAGNOSIS — L600 Ingrowing nail: Secondary | ICD-10-CM

## 2024-05-19 NOTE — Progress Notes (Signed)
 Chief Complaint  Patient presents with   Bunions    Pt is here for surgical consult for left bunion, states still very painful.    Subjective: 68 y.o. male presents today for surgical consultation of left bunion and tailor's bunion deformity to the left foot.  He has been treated and managed by Dr. Christine here in our office.  Referred to me for surgical consultation.  Brief history: History of symptomatic tailor's bunion and bunion intermittently for several years.  He has tried different shoe gear modifications and anti-inflammatories with no lasting improvement or permanent relief.  It is affecting his daily activities and ability to be as active as he would like.  Past Medical History:  Diagnosis Date   Diabetes mellitus without complication (HCC)    a. diet controlled (01/2013)   GERD (gastroesophageal reflux disease)    Morbid obesity (HCC)    a. s/p gastric bypass    Past Surgical History:  Procedure Laterality Date   CHOLECYSTECTOMY     a. 2004   GASTRIC BYPASS     a. 2005    Allergies  Allergen Reactions   Erythromycin      Objective: Physical Exam General: The patient is alert and oriented x3 in no acute distress.  Dermatology: Skin is cool, dry and supple bilateral lower extremities. Negative for open lesions or macerations.  Vascular: Palpable pedal pulses bilaterally. No edema or erythema noted. Capillary refill within normal limits.  Neurological: Grossly intact via light touch  Musculoskeletal Exam: Clinical evidence of bunion deformity noted to the respective foot. There is moderate pain on palpation range of motion of the first MPJ. Lateral deviation of the hallux noted consistent with hallux abductovalgus.  Radiographic Exam LT foot 05/19/2024: Normal osseous mineralization.  No acute fractures identified.  Metatarsus adductus noted with medial deviation of the metatarsals as well as hallux valgus deformity.  Lateral deviation of the fifth metatarsal  head with prominence consistent with a tailor's bunion deformity  Assessment: 1.  Hallux valgus left 2.  Tailor's bunionette left 3.  Metatarsus adductus left   Plan of Care:  -Patient was evaluated. X-Rays reviewed. -After evaluating and discussing with the patient I do believe that it would be excessive to correct for the metatarsus adductus of the foot.  I believe we can get decent correction with the hallux valgus and correct for the tailor's bunion deformity and this should help alleviate the patient's symptoms without additional surgery throughout the midfoot.  This was discussed in detail with the patient including the risk benefits advantages and disadvantages of the procedures.  No guarantees were expressed or implied.  All patient questions were answered.  Postoperative recovery course was also explained in detail.  Patient consents would like to proceed with surgery -Authorization for surgery was initiated today.  Surgery will consist of bunionectomy with osteotomy left.  Tailor's bunionectomy with osteotomy left. 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    Thresa EMERSON Sar, DPM [facility]  Dr. Thresa EMERSON Sar, DPM    [address] [place] [address]                Office [phone]  Fax [phone]

## 2024-05-19 NOTE — Progress Notes (Signed)
 Patient presents follow-up nail surgery right great toe.  No complaints.  Physical exam:  Dermatologic: Nail surgery site for matrixectomy healing well with no signs of infection.  Diagnosis: 1.  Status post matrixectomy toe.  Healing well  Plan: - POV status post nail surgery hallux right if patient has any problems she can call for appointment otherwise we can see her as needed - Return as needed

## 2024-05-27 ENCOUNTER — Telehealth: Payer: Self-pay | Admitting: Podiatry

## 2024-05-27 NOTE — Telephone Encounter (Signed)
 DOS- 06/17/2024  [place] BUNIONECTOMY LT- 71703 METATARSAL OSTEOTOMY LT- [id number]  [employer] EFFECTIVE DATE- 09/17/2023  DEDUCTIBLE- $0 REMAINING- $0 OOP- $7500 REMAINING- $6528.55 COINSURANCE- 0%  PER [employer] WEBSITE, NO PRIOR AUTH IS REQUIRED FOR CPT CODES 71703 AND 28308. DOCUMENTATION ATTACHED TO SURGERY CONSENT PACKET.

## 2024-06-17 ENCOUNTER — Other Ambulatory Visit: Payer: Self-pay | Admitting: Podiatry

## 2024-06-17 DIAGNOSIS — M21622 Bunionette of left foot: Secondary | ICD-10-CM | POA: Diagnosis not present

## 2024-06-17 DIAGNOSIS — M2012 Hallux valgus (acquired), left foot: Secondary | ICD-10-CM | POA: Diagnosis not present

## 2024-06-17 MED ORDER — OXYCODONE-ACETAMINOPHEN 5-325 MG PO TABS: 1.0000 | ORAL_TABLET | ORAL | 0 refills | Status: AC | PRN

## 2024-06-17 MED ORDER — IBUPROFEN 800 MG PO TABS: 800.0000 mg | ORAL_TABLET | Freq: Three times a day (TID) | ORAL | 1 refills | Status: AC | PRN

## 2024-06-17 NOTE — Progress Notes (Signed)
 PRN postop

## 2024-06-23 ENCOUNTER — Ambulatory Visit (INDEPENDENT_AMBULATORY_CARE_PROVIDER_SITE_OTHER)

## 2024-06-23 ENCOUNTER — Ambulatory Visit: Admitting: Podiatry

## 2024-06-23 ENCOUNTER — Encounter: Payer: Self-pay | Admitting: Podiatry

## 2024-06-23 VITALS — BP 125/60 | HR 46 | Temp 98.4°F

## 2024-06-23 DIAGNOSIS — Z9889 Other specified postprocedural states: Secondary | ICD-10-CM

## 2024-06-23 DIAGNOSIS — M79672 Pain in left foot: Secondary | ICD-10-CM | POA: Diagnosis not present

## 2024-06-23 NOTE — Patient Instructions (Signed)

## 2024-06-23 NOTE — Progress Notes (Unsigned)
 Patient presents for post-op visit today, POV # 1 DOS 06/17/24 LT BUNIONECTOMY W/ OSTEOTOMY, LT TAILORS BUNIONECTOMY W/ OSTEOTOMY  I'm good, other than some pain and all that. Not taking the boot off. Has been weight-bearing, but only when he had to, per patient.   Low Pulse: Patient reports his heart rate if normally in the 40's and 50's. He denies lightheadedness and dizziness.  Vital Signs:  Today's Vitals   06/23/24 1052 06/23/24 1056  BP: 125/60   Pulse: (!) 44 (!) 46  Temp: 98.4 F (36.9 C)   TempSrc: Oral   PainSc: 3    PainLoc: Foot     Radiographs: [x]  Taken []  Not taken  Surgical Site Assessment:  - Dressing:  [x]  Minimal dry blood, intact []  Reinforced   []  Changed    - Incision:  [x]  CDI (clean, dry, intact)  []  Mild erythema  []  Drainage noted  - Swelling:  []  None  [x]  Mild  []  Moderate   []  Significant    - Bruising:  [x]  None  []  Present    - Sutures/staples:  [x]  Intact  []  Removed  [x]  Plan to remove at next visit    -Cast/Splint/Pins: [x]  None []  Intact []  Removed []  Plan to remove at next visit []  Replaced  - Signs of infection:  [x]  None  []  Present - Describe:   -DME:    [x]  AFW []  Surgical shoe []  Cast  []  Splint  -Walking status:  [x]  WB  []  Partial WB  []  NWB  -Utilizing device:  []  None [x]  Knee Scooter []  Crutches []  Wheelchair    DVT assessment:  [x]  Denies symptoms []  Chest pain/SOB []  Pain in calf/redness/warmth     Redressed DSD and ace wrap. Educated on signs of infection, proper dressing care, pain management, and weight bearing status. Patient will contact provider with any new or worsening symptoms. The provider assessed the patient today and reviewed instructions regarding plan of care.

## 2024-06-25 NOTE — Progress Notes (Signed)
   Chief Complaint  Patient presents with   Post-op Follow-up    POV # 1 DOS 06/17/24 LT BUNIONECTOMY W/ OSTEOTOMY, LT TAILORS BUNIONECTOMY W/ OSTEOTOMY  I'm good, other than some pain and all that. Not taking the boot off. Has been weight-bearing, but only when he had to, per patient.     Subjective:  Patient presents today status post bunionectomy with first metatarsal osteotomy and tailor's bunionectomy with fifth metatarsal osteotomy left.  DOS: 06/17/2024.  Patient doing well.  WBAT CAM boot as instructed.  Past Medical History:  Diagnosis Date   Diabetes mellitus without complication (HCC)    a. diet controlled (01/2013)   GERD (gastroesophageal reflux disease)    Morbid obesity (HCC)    a. s/p gastric bypass    Past Surgical History:  Procedure Laterality Date   CHOLECYSTECTOMY     a. 2004   GASTRIC BYPASS     a. 2005    Allergies  Allergen Reactions   Macrolides And Ketolides Shortness Of Breath   Erythromycin     Objective/Physical Exam Neurovascular status intact.  Incision well coapted with sutures intact. No sign of infectious process noted. No dehiscence. No active bleeding noted.  Moderate edema noted to the surgical extremity.  Radiographic Exam LT foot 06/23/2024:  Orthopedic hardware and osteotomies sites appear to be stable with routine healing.  Assessment: 1. s/p bunionectomy and tailor's bunionectomy left. DOS: 06/17/2024   Plan of Care:  -Patient was evaluated. X-rays reviewed - Dressings changed -Continue WBAT cam boot -Return to clinic 1 week   Thresa EMERSON Sar, DPM [facility]  Dr. Thresa EMERSON Sar, DPM    [address] [place], [place] [address]                Office [phone]  Fax [phone]

## 2024-06-30 ENCOUNTER — Ambulatory Visit (INDEPENDENT_AMBULATORY_CARE_PROVIDER_SITE_OTHER): Admitting: Podiatry

## 2024-06-30 VITALS — BP 119/62 | HR 46 | Temp 97.7°F

## 2024-06-30 DIAGNOSIS — Z9889 Other specified postprocedural states: Secondary | ICD-10-CM

## 2024-06-30 DIAGNOSIS — M21622 Bunionette of left foot: Secondary | ICD-10-CM

## 2024-06-30 NOTE — Progress Notes (Signed)
 Patient presents for post-op visit today, POV # 2 DOS 06/17/24 LT BUNIONECTOMY W/ OSTEOTOMY, LT TAILORS BUNIONECTOMY W/ OSTEOTOMY  Doing good. Has been staying off foot as much as I can, I have a handicap daughter that requires my attention. Pain every once in awhile. .   Vital Signs: Today's Vitals   06/30/24 1030  BP: 119/62  Pulse: (!) 46  Temp: 97.7 F (36.5 C)  TempSrc: Oral  PainSc: 1   PainLoc: Foot  Educated on need for follow up with PCP regarding low HR.   Radiographs: []  Taken [x]  Not taken  Surgical Site Assessment:  - Dressing:  [x]  Minimal dry blood, intact []  Reinforced   []  Changed     -RN Notes: betadine present on dressing from last visit.   - Incision:  [x]  CDI (clean, dry, intact)  []  Mild erythema  []  Drainage noted   -RN Notes: n/a  - Swelling:  []  None  [x]  Mild  []  Moderate   []  Significant    - Bruising:  [x]  None  []  Present: n/a  - Sutures/staples:  [x]  Intact  []  Removed Today  []  Plan to remove at next visit    -Cast/Splint/Pins: [x]  None []  Intact  []  Removed []  Plan to remove at next visit []  Replaced  -Signs of infection:  [x]  None  []  Present - Describe: n/a  -DME:    [x]  AFW []  Surgical shoe []  Cast  []  Splint  -Walking status:  []  Full WB  [x]  Partial WB  []  NWB  -Utilizing device:  []  None [x]  Knee Scooter []  Crutches []  Wheelchair    DVT assessment:  [x]  Denies symptoms []  Chest pain/SOB []  Pain in calf/redness/warmth   Redressed DSD and ace wrap. Educated on signs of infection, proper dressing care, pain management, and weight bearing status. Patient will contact provider with any new or worsening symptoms. The provider assessed the patient today and reviewed instructions regarding plan of care.

## 2024-06-30 NOTE — Progress Notes (Signed)
   Chief Complaint  Patient presents with   Post-op Follow-up    POV # 2 DOS 06/17/24 LT BUNIONECTOMY W/ OSTEOTOMY, LT TAILORS BUNIONECTOMY W/ OSTEOTOMY  Doing good. Has been staying off foot as much as I can, I have a handicap daughter that requires my attention. Pain every once in awhile.     Subjective:  Patient presents today status post bunionectomy with first metatarsal osteotomy and tailor's bunionectomy with fifth metatarsal osteotomy left.  DOS: 06/17/2024.  Patient doing well.  WBAT CAM boot as instructed.  Past Medical History:  Diagnosis Date   Diabetes mellitus without complication (HCC)    a. diet controlled (01/2013)   GERD (gastroesophageal reflux disease)    Morbid obesity (HCC)    a. s/p gastric bypass    Past Surgical History:  Procedure Laterality Date   CHOLECYSTECTOMY     a. 2004   GASTRIC BYPASS     a. 2005    Allergies  Allergen Reactions   Macrolides And Ketolides Shortness Of Breath   Erythromycin     Objective/Physical Exam Neurovascular status intact.  Incision well coapted with staples intact. No sign of infectious process noted. No dehiscence. No active bleeding noted.  Moderate edema noted to the surgical extremity.  Radiographic Exam LT foot 06/23/2024:  Orthopedic hardware and osteotomies sites appear to be stable with routine healing.  Assessment: 1. s/p bunionectomy and tailor's bunionectomy left. DOS: 06/17/2024   Plan of Care:  -Patient was evaluated.  -Staples removed -Continue minimal WBAT cam boot with the assistance of a knee scooter -Return to clinic 3 weeks follow-up x-ray and to transition patient out of the cam boot into good supportive tennis shoes and sneakers   Thresa EMERSON Sar, DPM [facility]  Dr. Thresa EMERSON Sar, DPM    [address] [place], [place] [address]                Office [phone]  Fax [phone]

## 2024-07-14 ENCOUNTER — Ambulatory Visit (INDEPENDENT_AMBULATORY_CARE_PROVIDER_SITE_OTHER)

## 2024-07-14 ENCOUNTER — Encounter: Payer: Self-pay | Admitting: Podiatry

## 2024-07-14 ENCOUNTER — Ambulatory Visit (INDEPENDENT_AMBULATORY_CARE_PROVIDER_SITE_OTHER): Admitting: Podiatry

## 2024-07-14 VITALS — Ht 73.0 in | Wt 320.0 lb

## 2024-07-14 DIAGNOSIS — M21622 Bunionette of left foot: Secondary | ICD-10-CM | POA: Diagnosis not present

## 2024-07-14 NOTE — Progress Notes (Signed)
   Chief Complaint  Patient presents with   Routine Post Op    POV # 3 DOS 06/17/24 LT BUNIONECTOMY W/ OSTEOTOMY, LT TAILORS BUNIONECTOMY W/ OSTEOTOMY, pt states everything is going well, still some swelling to the foot, continues to wear boot and knee scooter to ambulate.    Subjective:  Patient presents today status post bunionectomy with first metatarsal osteotomy and tailor's bunionectomy with fifth metatarsal osteotomy left.  DOS: 06/17/2024.  Patient doing well.  WBAT CAM boot as instructed with occasional assistance from the knee scooter  Past Medical History:  Diagnosis Date   Diabetes mellitus without complication (HCC)    a. diet controlled (01/2013)   GERD (gastroesophageal reflux disease)    Morbid obesity (HCC)    a. s/p gastric bypass    Past Surgical History:  Procedure Laterality Date   CHOLECYSTECTOMY     a. 2004   GASTRIC BYPASS     a. 2005    Allergies  Allergen Reactions   Macrolides And Ketolides Shortness Of Breath   Erythromycin     Objective/Physical Exam Neurovascular status intact.  Incision is nicely healed.  There continues to be some slight edema to the forefoot.  Good range of motion of the 1st and 5th MTP.  No complicating features noted.  Overall well-healing surgical foot  Radiographic Exam LT foot 07/14/2024:  Unchanged.  Orthopedic hardware and osteotomies sites appear to be stable with routine healing.  Assessment: 1. s/p bunionectomy and tailor's bunionectomy left. DOS: 06/17/2024   Plan of Care:  -Patient was evaluated.  - FWB in the cam boot for an additional 2 weeks.  After that he may transition slowly over 2-week period into good supportive tennis shoes and sneakers. -Continue compression ankle sleeves provided -Return to clinic 8 weeks for follow-up x-ray left foot and possible surgical consult right foot  Thresa EMERSON Sar, DPM [facility]  Dr. Thresa EMERSON Sar, DPM    [address] [place], [place] [address]                Office [phone]  Fax [phone]

## 2024-08-04 ENCOUNTER — Ambulatory Visit: Attending: Cardiology | Admitting: Cardiology

## 2024-08-04 ENCOUNTER — Ambulatory Visit

## 2024-08-04 ENCOUNTER — Encounter: Payer: Self-pay | Admitting: Cardiology

## 2024-08-04 VITALS — BP 130/73 | HR 59 | Ht 74.0 in | Wt 251.0 lb

## 2024-08-04 DIAGNOSIS — I495 Sick sinus syndrome: Secondary | ICD-10-CM

## 2024-08-04 DIAGNOSIS — Z794 Long term (current) use of insulin: Secondary | ICD-10-CM

## 2024-08-04 DIAGNOSIS — E119 Type 2 diabetes mellitus without complications: Secondary | ICD-10-CM | POA: Diagnosis not present

## 2024-08-04 DIAGNOSIS — R001 Bradycardia, unspecified: Secondary | ICD-10-CM

## 2024-08-04 NOTE — Progress Notes (Signed)
 Cardiology Office Note:  .   Date:  08/05/2024  ID:  Mitchell Hampton, DOB [birth year], MRN [id number] PCP: Nanci Senior, MD  [facility] Providers Cardiologist:  Alm Clay, MD     Chief Complaint  Patient presents with   New Patient (Initial Visit)    Evaluate bradycardia    Patient Profile: .     Mitchell Hampton is a mildly obese 68 y.o. male with a PMH notable for DM-2, HTN who presents here for evaluation of bradycardia at the request of Nanci Senior, MD.     Mitchell Hampton was last seen on July 07, 2024 by Dr. Billy in follow-up after his left foot surgery for bunionectomy.  He came in because his watch was indicating heart rates in the 45 to 50 bpm range.  Maybe has some mild indigestion but no other cardiac symptoms.  Referred to cardiology.  Subjective  Discussed the use of AI scribe software for clinical note transcription with the patient, who gave verbal consent to proceed.  History of Present Illness Mitchell Hampton is a 68 year old male who presents with bradycardia. He was referred by his foot surgeon due to concerns about low heart rate during a post-operative checkup.  He presents for evaluation of bradycardia after his heart rate was noted to be 46-47 bpm during a post-operative checkup following foot surgery six weeks ago. No symptoms of fatigue, dizziness, lightheadedness, shortness of breath, or chest pain. He denies any episodes of syncope or stroke-like symptoms. He has not experienced any shortness of breath at night and typically sleeps on two pillows.  He underwent bunion surgery on his left foot six weeks ago. 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  Pretty much since his surgery he has not been noted to be all that active.  He denies any symptoms to suggest chronotropic incompetence.  He is not on any medications to slow his heart rate down.  He has a history of diabetes, managed  with metformin  2000 mg once daily and semaglutide 0.5 mg weekly. His last A1c was 5.5. He is also on lisinopril  10 mg daily, prescribed to protect his organs due to diabetes, despite not having high blood pressure. He does not take aspirin  regularly and uses Advil only as needed for pain.  His last cholesterol panel was in December, showing total cholesterol of 112, HDL of 47, LDL of 53, and triglycerides of 51. He has not had recent blood work but recalls a chemistry panel done in the summer.   Objective   Current meds include lisinopril  10 mg daily, Glucophage  2000 mg daily, semaglutide 2 mg (0.5 mg weekly)  Studies Reviewed: SABRA   EKG Interpretation Date/Time:  Wednesday August 04 2024 15:05:48 EST Ventricular Rate:  59 PR Interval:  158 QRS Duration:  92 QT Interval:  394 QTC Calculation: 390 R Axis:   45  Text Interpretation: Sinus bradycardia When compared with ECG of 21-Feb-2013 13:31, No significant change was found Confirmed by Clay Alm ([id number]) on 08/04/2024 4:10:34 PM    Results LABS  (08/2023): Cholesterol Panel: Total cholesterol 112, HDL 47, LDL 53, Triglycerides 51; glucose 78, BUN 14, CR 1.07, NA 139, K+ 4.7.  DIAGNOSTIC EKG: Normal (08/04/2024)  Echocardiogram 02/22/2013: Normal LV size and function.  EF 55%.  No RWMA.  Normal RV with exception of mild RV dilation.  Not likely reliable.  Normal valves.  Risk Assessment/Calculations:  Physical Exam:   VS:  BP 130/73 (BP Location: Left Arm, Patient Position: Sitting, Cuff Size: Large)   Pulse (!) 59   Ht 6' 2 (1.88 m)   Wt 251 lb (113.9 kg)   SpO2 99%   BMI 32.23 kg/m    Wt Readings from Last 3 Encounters:  08/04/24 251 lb (113.9 kg)  07/14/24 (!) 320 lb (145.2 kg)  05/19/24 (!) 320 lb (145.2 kg)      GEN: Well nourished, well developed in no acute distress; healthy-appearing NECK: No JVD; No carotid bruits CARDIAC: Normal S1, S2; RRR, no murmurs, rubs, gallops RESPIRATORY:  Clear  to auscultation without rales, wheezing or rhonchi ; nonlabored, good air movement. ABDOMEN: Soft, non-tender, non-distended EXTREMITIES:  No edema; No deformity      ASSESSMENT AND PLAN: .    Problem List Items Addressed This Visit       Cardiology Problems   Sinus bradycardia - Primary (Chronic)   Sinus bradycardia impression is relatively active and asymptomatic.  Heart rate 46-47 bpm post-surgery, asymptomatic. EKG normal sinus rhythm, heart rate 59 bpm not concerning unless persistent in 40s. No significant pauses or drop beats. - Ordered 3-day heart monitor to assess heart rate range and detect significant pauses or drop beats. - Scheduled virtual follow-up on December 8th to discuss monitor results.      Relevant Orders   EKG 12-Lead (Completed)   LONG TERM MONITOR (3-14 DAYS)     Other   Diabetes (HCC) (Chronic)   Well-controlled with A1c 5.5%. Managed with metformin , semaglutide, and lisinopril  for renal and cardiac protection. - Continue metformin  2000 mg once daily. - Continue semaglutide 0.5 mL weekly. - Continue lisinopril  for renal and cardiac protection.               Follow-Up: Return in about 19 days (around 08/23/2024) for Routine follow up with me, Followup with Telemedicine, To discuss test results.     Signed, Alm MICAEL Clay, MD, MS Alm Clay, M.D., M.S. Interventional Cardiologist  [facility] Pager # [phone]

## 2024-08-04 NOTE — Progress Notes (Unsigned)
 Enrolled for [employer] to mail a ZIO XT long term holter monitor to the patients address on file.

## 2024-08-04 NOTE — Patient Instructions (Signed)
 Medication Instructions:  Your physician recommends that you continue on your current medications as directed. Please refer to the Current Medication list given to you today.  *If you need a refill on your cardiac medications before your next appointment, please call your pharmacy*  Lab Work: None ordered  If you have any lab test that is abnormal or we need to change your treatment, we will call you to review the results.  Testing/Procedures:                          GEOFFRY HEWS- Long Term Monitor Instructions  Your physician has requested you wear a ZIO patch monitor for 3 days.  This is a single patch monitor. [employer] supplies one patch monitor per enrollment. Additional stickers are not available. Please do not apply patch if you will be having a Nuclear Stress Test,  Echocardiogram, Cardiac CT, MRI, or Chest Xray during the period you would be wearing the  monitor. The patch cannot be worn during these tests. You cannot remove and re-apply the  ZIO XT patch monitor.  Your ZIO patch monitor will be mailed 3 day USPS to your address on file. It may take 3-5 days  to receive your monitor after you have been enrolled.  Once you have received your monitor, please review the enclosed instructions. Your monitor  has already been registered assigning a specific monitor serial # to you.  Billing and Patient Assistance Program Information  We have supplied [employer] with any of your insurance information on file for billing purposes. [employer] offers a sliding scale Patient Assistance Program for patients that do not have  insurance, or whose insurance does not completely cover the cost of the ZIO monitor.  You must apply for the Patient Assistance Program to qualify for this discounted rate.  To apply, please call [employer] at [phone], select option 4, select option 2, ask to apply for  Patient Assistance Program. Meredeth will ask your household income, and how many people  are in your  household. They will quote your out-of-pocket cost based on that information.  [employer] will also be able to set up a 88-month, interest-free payment plan if needed.  Applying the monitor   Shave hair from upper left chest.  Hold abrader disc by orange tab. Rub abrader in 40 strokes over the upper left chest as  indicated in your monitor instructions.  Clean area with 4 enclosed alcohol pads. Let dry.  Apply patch as indicated in monitor instructions. Patch will be placed under collarbone on left  side of chest with arrow pointing upward.  Rub patch adhesive wings for 2 minutes. Remove white label marked 1. Remove the white  label marked 2. Rub patch adhesive wings for 2 additional minutes.  While looking in a mirror, press and release button in center of patch. A small green light will  flash 3-4 times. This will be your only indicator that the monitor has been turned on.  Do not shower for the first 24 hours. You may shower after the first 24 hours.  Press the button if you feel a symptom. You will hear a small click. Record Date, Time and  Symptom in the Patient Logbook.  When you are ready to remove the patch, follow instructions on the last 2 pages of Patient  Logbook. Stick patch monitor onto the last page of Patient Logbook.  Place Patient Logbook in the blue and white box. Use locking tab on  box and tape box closed  securely. The blue and white box has prepaid postage on it. Please place it in the mailbox as  soon as possible. Your physician should have your test results approximately 7 days after the  monitor has been mailed back to [facility].  Call [facility] Customer Care at [phone] if you have questions regarding  your ZIO XT patch monitor. Call them immediately if you see an orange light blinking on your  monitor.  If your monitor falls off in less than 4 days, contact our Monitor department at [phone].  If your monitor becomes loose or falls off after  4 days call [employer] at [phone] for  suggestions on securing your monitor    Follow-Up: At [facility], you and your health needs are our priority.  As part of our continuing mission to provide you with exceptional heart care, our providers are all part of one team.  This team includes your primary Cardiologist (physician) and Advanced Practice Providers or APPs (Physician Assistants and Nurse Practitioners) who all work together to provide you with the care you need, when you need it.  Your next appointment:   12/8    Provider:   Virtual visit with Dr. Anner     Thank you for choosing [facility]!!   [phone]

## 2024-08-05 ENCOUNTER — Encounter: Payer: Self-pay | Admitting: Cardiology

## 2024-08-05 DIAGNOSIS — R001 Bradycardia, unspecified: Secondary | ICD-10-CM | POA: Insufficient documentation

## 2024-08-05 NOTE — Assessment & Plan Note (Signed)
 Sinus bradycardia impression is relatively active and asymptomatic.  Heart rate 46-47 bpm post-surgery, asymptomatic. EKG normal sinus rhythm, heart rate 59 bpm not concerning unless persistent in 40s. No significant pauses or drop beats. - Ordered 3-day heart monitor to assess heart rate range and detect significant pauses or drop beats. - Scheduled virtual follow-up on December 8th to discuss monitor results.

## 2024-08-05 NOTE — Assessment & Plan Note (Signed)
 Well-controlled with A1c 5.5%. Managed with metformin , semaglutide, and lisinopril  for renal and cardiac protection. - Continue metformin  2000 mg once daily. - Continue semaglutide 0.5 mL weekly. - Continue lisinopril  for renal and cardiac protection.

## 2024-08-23 ENCOUNTER — Ambulatory Visit: Admitting: Cardiology

## 2024-09-03 ENCOUNTER — Ambulatory Visit: Payer: Self-pay | Admitting: Cardiology

## 2024-09-03 DIAGNOSIS — I495 Sick sinus syndrome: Secondary | ICD-10-CM

## 2024-09-06 ENCOUNTER — Ambulatory Visit (INDEPENDENT_AMBULATORY_CARE_PROVIDER_SITE_OTHER)

## 2024-09-06 ENCOUNTER — Encounter: Payer: Self-pay | Admitting: Podiatry

## 2024-09-06 ENCOUNTER — Ambulatory Visit: Admitting: Podiatry

## 2024-09-06 VITALS — Ht 74.0 in | Wt 251.0 lb

## 2024-09-06 DIAGNOSIS — M21622 Bunionette of left foot: Secondary | ICD-10-CM | POA: Diagnosis not present

## 2024-09-06 DIAGNOSIS — Z4789 Encounter for other orthopedic aftercare: Secondary | ICD-10-CM

## 2024-09-06 NOTE — Progress Notes (Signed)
" ° °  No chief complaint on file.   Subjective:  Patient presents today status post bunionectomy with first metatarsal osteotomy and tailor's bunionectomy with fifth metatarsal osteotomy left.  DOS: 06/17/2024.   Past Medical History:  Diagnosis Date   Diabetes mellitus without complication (HCC)    a. diet controlled (01/2013)   GERD (gastroesophageal reflux disease)    Morbid obesity (HCC)    a. s/p gastric bypass    Past Surgical History:  Procedure Laterality Date   CHOLECYSTECTOMY     a. 2004   GASTRIC BYPASS     a. 2005    Allergies  Allergen Reactions   Macrolides And Ketolides Shortness Of Breath   Erythromycin     Objective/Physical Exam Neurovascular status intact.  Incision is nicely healed.  Good range of motion of the 1st and 5th MTP.  Mild edema noted.  There is no erythema or concern for infection.  Overall well-healing surgical foot  Radiographic Exam LT foot 07/14/2024:  Unchanged.  Orthopedic hardware and osteotomies sites appear to be stable with routine healing.  Assessment: 1. s/p bunionectomy and tailor's bunionectomy left. DOS: 06/17/2024   Plan of Care:  -Patient was evaluated.  - The patient is now over 2 months postoperatively.  He can essentially slowly return to full activity with no restrictions and good supportive tennis shoes and sneakers -Return to clinic PRN  Thresa EMERSON Sar, DPM [facility]  Dr. Thresa EMERSON Sar, DPM    [address] [place], [place] [address]                Office [phone]  Fax [phone]      "

## 2024-09-13 ENCOUNTER — Encounter: Admitting: Podiatry
